# Patient Record
Sex: Female | Born: 1975 | Race: Black or African American | Marital: Single | Smoking: Never smoker
Health system: Southern US, Community
[De-identification: ages and names within clinical notes are randomized; demographics above are authoritative.]

## PROBLEM LIST (undated history)

## (undated) ENCOUNTER — Inpatient Hospital Stay: Payer: Self-pay

## (undated) DIAGNOSIS — D649 Anemia, unspecified: Secondary | ICD-10-CM

## (undated) DIAGNOSIS — I10 Essential (primary) hypertension: Secondary | ICD-10-CM

## (undated) DIAGNOSIS — R011 Cardiac murmur, unspecified: Secondary | ICD-10-CM

## (undated) HISTORY — PX: WISDOM TOOTH EXTRACTION: SHX21

---

## 2012-05-09 DIAGNOSIS — R21 Rash and other nonspecific skin eruption: Secondary | ICD-10-CM | POA: Insufficient documentation

## 2012-09-04 DIAGNOSIS — Z22330 Carrier of Group B streptococcus: Secondary | ICD-10-CM | POA: Insufficient documentation

## 2012-09-04 DIAGNOSIS — O480 Post-term pregnancy: Secondary | ICD-10-CM | POA: Insufficient documentation

## 2015-09-16 NOTE — L&D Delivery Note (Signed)
Delivery Note  First Stage: Labor induction with buccal Cytotec, then Pitocin, and Cervidil Labor onset: 08/30/16 @ 1120 am Augmentation: Pitocin  Analgesia /Anesthesia intrapartum: epidural  SROM at 1120   Second Stage: Complete dilation at 1321 Onset of pushing at 1325 FHR second stage: Category 2: Baseline: 125 bpm/ moderate variability/ +accels/ variable decels with each ctx to nadir of 60 bpm with good return to baseline and positive scalp stimulation   She progressed from 2cm at 1120 to complete at 1321 after SROM.  She pushed 3 times with delivery of a viable female at [id number] by Carlean JewsMeredith Sigmon, CNM in LOA position.  She had a loose nuchal cord reduced over head.  Baby was placed on mom's abdomen, vigorous, and dried. The cord double clamped after cessation of pulsation, cut by FOB. Cord blood sample collected   Third Stage: Placenta delivered via Tomasa BlaseSchultz intact with trailing membranes I teased out using ring forceps with a 3 VC @ 1412 Placenta disposition: surgical pathology  Uterine tone firm / bleeding minimal  No lacerations identified  Est. Blood Loss (mL): 350 mL  Mom to postpartum.  Baby to Couplet care / Skin to Skin.  Newborn: Birth Weight: 2510 grams (5#9oz) Apgar Scores: 8, 9 Feeding planned: Formula   Carlean JewsMeredith Sigmon, CNM

## 2015-10-21 ENCOUNTER — Encounter: Payer: Self-pay | Admitting: Emergency Medicine

## 2015-10-21 ENCOUNTER — Emergency Department
Admission: EM | Admit: 2015-10-21 | Discharge: 2015-10-21 | Disposition: A | Discharge status: Home / Self Care | Payer: BC Managed Care – PPO | Attending: Emergency Medicine | Admitting: Emergency Medicine

## 2015-10-21 ENCOUNTER — Emergency Department: Payer: BC Managed Care – PPO

## 2015-10-21 DIAGNOSIS — S99911A Unspecified injury of right ankle, initial encounter: Secondary | ICD-10-CM | POA: Diagnosis present

## 2015-10-21 DIAGNOSIS — Y9389 Activity, other specified: Secondary | ICD-10-CM | POA: Diagnosis not present

## 2015-10-21 DIAGNOSIS — Y9289 Other specified places as the place of occurrence of the external cause: Secondary | ICD-10-CM | POA: Insufficient documentation

## 2015-10-21 DIAGNOSIS — Y998 Other external cause status: Secondary | ICD-10-CM | POA: Diagnosis not present

## 2015-10-21 DIAGNOSIS — X58XXXA Exposure to other specified factors, initial encounter: Secondary | ICD-10-CM | POA: Diagnosis not present

## 2015-10-21 DIAGNOSIS — S92001A Unspecified fracture of right calcaneus, initial encounter for closed fracture: Secondary | ICD-10-CM | POA: Diagnosis not present

## 2015-10-21 MED ORDER — HYDROCODONE-ACETAMINOPHEN 5-325 MG PO TABS
1.0000 | ORAL_TABLET | Freq: Once | ORAL | Status: AC
  Administered 2015-10-21: 1 via ORAL
  Filled 2015-10-21: qty 1

## 2015-10-21 MED ORDER — HYDROCODONE-ACETAMINOPHEN 5-325 MG PO TABS: 1.0000 | ORAL_TABLET | ORAL | Status: DC | PRN

## 2015-10-21 NOTE — Discharge Instructions (Signed)
Cast or Splint Care °Casts and splints support injured limbs and keep bones from moving while they heal.  °HOME CARE °· Keep the cast or splint uncovered during the drying period. °¨ A plaster cast can take 24 to 48 hours to dry. °¨ A fiberglass cast will dry in less than 1 hour. °· Do not rest the cast on anything harder than a pillow for 24 hours. °· Do not put weight on your injured limb. Do not put pressure on the cast. Wait for your doctor's approval. °· Keep the cast or splint dry. °¨ Cover the cast or splint with a plastic bag during baths or wet weather. °¨ If you have a cast over your chest and belly (trunk), take sponge baths until the cast is taken off. °¨ If your cast gets wet, dry it with a towel or blow dryer. Use the cool setting on the blow dryer. °· Keep your cast or splint clean. Wash a dirty cast with a damp cloth. °· Do not put any objects under your cast or splint. °· Do not scratch the skin under the cast with an object. If itching is a problem, use a blow dryer on a cool setting over the itchy area. °· Do not trim or cut your cast. °· Do not take out the padding from inside your cast. °· Exercise your joints near the cast as told by your doctor. °· Raise (elevate) your injured limb on 1 or 2 pillows for the first 1 to 3 days. °GET HELP IF: °· Your cast or splint cracks. °· Your cast or splint is too tight or too loose. °· You itch badly under the cast. °· Your cast gets wet or has a soft spot. °· You have a bad smell coming from the cast. °· You get an object stuck under the cast. °· Your skin around the cast becomes red or sore. °· You have new or more pain after the cast is put on. °GET HELP RIGHT AWAY IF: °· You have fluid leaking through the cast. °· You cannot move your fingers or toes. °· Your fingers or toes turn blue or white or are cool, painful, or puffy (swollen). °· You have tingling or lose feeling (numbness) around the injured area. °· You have bad pain or pressure under the  cast. °· You have trouble breathing or have shortness of breath. °· You have chest pain. °  °This information is not intended to replace advice given to you by your health care provider. Make sure you discuss any questions you have with your health care provider. °  °Document Released: 01/01/2011 Document Revised: 05/04/2013 Document Reviewed: 03/10/2013 °Elsevier Interactive Patient Education ©2016 [employer] ° °

## 2015-10-21 NOTE — ED Notes (Signed)
Pt hurt ankle early this morning around 230-3am. Swelling noted to right lateral ankle and pt states she has difficulty bearing weight to right leg.

## 2015-10-21 NOTE — ED Provider Notes (Signed)
CSN: [id number]     Arrival date & time 10/21/15  0819 History   None    Chief Complaint  Patient presents with  . Ankle Pain      HPI Comments: 40 year old female presents today complaining of right ankle injury that occurred between 2:30-3am. Pt reports that she was standing on a stool helping her sister hang something when she stepped down to the ground and inverted the ankle. Since then it has been difficult to bear weight on the ankle. She has no history of prior ankle injuries. Has not taken anything for pain prior to arrival. Reports numbness in the foot and states she can not move her ankle or toes.   The history is provided by the patient.    History reviewed. No pertinent past medical history. History reviewed. No pertinent past surgical history. History reviewed. No pertinent family history. Social History  Substance Use Topics  . Smoking status: Never Smoker   . Smokeless tobacco: None  . Alcohol Use: Yes   OB History    No data available     Review of Systems  Musculoskeletal: Positive for myalgias, joint swelling, arthralgias and gait problem.  Skin: Negative for wound.  Neurological: Positive for numbness.  All other systems reviewed and are negative.     Allergies  Sulfa antibiotics  Home Medications   Prior to Admission medications   Medication Sig Start Date End Date Taking? Authorizing Provider  HYDROcodone-acetaminophen (NORCO/VICODIN) 5-325 MG tablet Take 1 tablet by mouth every 4 (four) hours as needed for severe pain. 10/21/15   Christella Scheuermann, PA-C   BP 156/89 mmHg  Pulse 98  Temp(Src) 98.5 F (36.9 C) (Oral)  Resp 16  Ht  (1.753 m)  Wt 63.504 kg  BMI 20.67 kg/m2  SpO2 100%  LMP 09/14/2015 Physical Exam  Constitutional: She is oriented to person, place, and time. Vital signs are normal. She appears well-developed and well-nourished. She is active. She appears distressed.  Mild distress due to pain   HENT:  Head: Normocephalic and  atraumatic.  Cardiovascular: Intact distal pulses.   Musculoskeletal: She exhibits tenderness.       Right ankle: She exhibits decreased range of motion and swelling. She exhibits no ecchymosis, no laceration and normal pulse. Tenderness. Lateral malleolus and head of 5th metatarsal tenderness found. No medial malleolus tenderness found. Achilles tendon normal.       Feet:  Cap refill < 2 seconds all toes. Pulses 2+ dorsalis pedis and posterior tibialis   Neurological: She is alert and oriented to person, place, and time.  Skin: Skin is warm and dry.  Psychiatric: She has a normal mood and affect. Her behavior is normal. Judgment and thought content normal.  Nursing note and vitals reviewed.   ED Course  Procedures (including critical care time) Labs Review Labs Reviewed - No data to display  Imaging Review Dg Ankle Complete Right  10/21/2015  ADDENDUM REPORT: 10/21/2015 09:48 ADDENDUM: There does appear to be a displaced calcaneal fracture best seen on oblique projection. Electronically Signed   By: Lupita Raider, M.D.   On: 10/21/2015 09:48  10/21/2015  CLINICAL DATA:  Acute right ankle pain after injury last night. Initial encounter. EXAM: RIGHT ANKLE - COMPLETE 3+ VIEW COMPARISON:  None. FINDINGS: There is no evidence of fracture, dislocation, or joint effusion. There is no evidence of arthropathy or other focal bone abnormality. Soft tissue swelling over lateral malleolus is noted. IMPRESSION: No fracture or  dislocation is noted. Soft tissue swelling is seen over lateral malleolus suggesting ligamentous injury. Electronically Signed: By: Lupita Raider, M.D. On: 10/21/2015 09:39   Dg Foot Complete Right  10/21/2015  CLINICAL DATA:  Pt stepped off a chair last night, pain and swelling mostly at the lateral malleolus area. Shielded. No previous injuries nor surgeries. Pt was in extreme pain- best images obtained EXAM: RIGHT FOOT COMPLETE - 3+ VIEW COMPARISON:  None. FINDINGS: Oblique  fracture of the calcaneus extending from the neck posteriorly through the superior aspect of the posterior calcaneus. Boehler's angle is intact. Fracture seen on the oblique view. IMPRESSION: Oblique fracture through the calcaneus which is minimally displaced. Electronically Signed   By: Genevive Bi M.D.   On: 10/21/2015 09:43   I have personally reviewed and evaluated these images and lab results as part of my medical decision-making.   EKG Interpretation None      MDM  10:00am - Discussed case with orthopedist on-call, Dr. Massie Maroon who recommend bulky posterior splint with slab at the heel. Non weight bearing. Needs to be seen in the office this week - Dr. Ernest Pine will be who she follows up with RX for Norco 5-325 #20 pills. Recommended taking Motrin in between these doses. Splint placed by tech and crutches provided to patient. No weight bearing at any time. Call Dr. Elenor Legato office tomorrow for appointment this week.  Final diagnoses:  Calcaneus fracture, right, closed, initial encounter        Christella Scheuermann, PA-C 10/21/15 1005  Sharman Cheek, MD 10/21/15 1520

## 2015-10-21 NOTE — ED Notes (Signed)
XR at bedside

## 2015-10-21 NOTE — ED Notes (Signed)
Pt was standing on chair helping sister hang something and stepped off wrong. Swelling to right ankle.

## 2015-10-23 ENCOUNTER — Other Ambulatory Visit: Payer: Self-pay | Admitting: Podiatry

## 2015-10-23 DIAGNOSIS — S92064A Nondisplaced intraarticular fracture of right calcaneus, initial encounter for closed fracture: Secondary | ICD-10-CM

## 2015-10-25 ENCOUNTER — Ambulatory Visit
Admission: RE | Admit: 2015-10-25 | Discharge: 2015-10-25 | Disposition: A | Discharge status: Home / Self Care | Payer: BC Managed Care – PPO | Source: Ambulatory Visit | Attending: Podiatry | Admitting: Podiatry

## 2015-10-25 DIAGNOSIS — S92061A Displaced intraarticular fracture of right calcaneus, initial encounter for closed fracture: Secondary | ICD-10-CM | POA: Insufficient documentation

## 2015-10-25 DIAGNOSIS — S92064A Nondisplaced intraarticular fracture of right calcaneus, initial encounter for closed fracture: Secondary | ICD-10-CM

## 2015-10-25 DIAGNOSIS — S92214A Nondisplaced fracture of cuboid bone of right foot, initial encounter for closed fracture: Secondary | ICD-10-CM | POA: Insufficient documentation

## 2015-10-25 DIAGNOSIS — X58XXXA Exposure to other specified factors, initial encounter: Secondary | ICD-10-CM | POA: Diagnosis not present

## 2015-11-01 ENCOUNTER — Encounter: Payer: Self-pay | Admitting: Medical Oncology

## 2015-11-01 ENCOUNTER — Emergency Department
Admission: EM | Admit: 2015-11-01 | Discharge: 2015-11-01 | Disposition: A | Discharge status: Home / Self Care | Payer: BC Managed Care – PPO | Attending: Emergency Medicine | Admitting: Emergency Medicine

## 2015-11-01 DIAGNOSIS — M79671 Pain in right foot: Secondary | ICD-10-CM

## 2015-11-01 DIAGNOSIS — M25571 Pain in right ankle and joints of right foot: Secondary | ICD-10-CM

## 2015-11-01 MED ORDER — HYDROCODONE-ACETAMINOPHEN 5-325 MG PO TABS: 1.0000 | ORAL_TABLET | ORAL | Status: DC | PRN

## 2015-11-01 MED ORDER — MELOXICAM 15 MG PO TABS: 15.0000 mg | ORAL_TABLET | Freq: Every day | ORAL | Status: DC

## 2015-11-01 NOTE — ED Notes (Signed)
Pt reports she broke her rt heel 2 weeks ago and was seen here and then f/u with ortho, continues to have pain.

## 2015-11-01 NOTE — ED Notes (Signed)
States she was dx'd with heel fx about 2 weeks ago after jumping from chair  Right foot is in boot. Out of pain meds

## 2015-11-01 NOTE — Discharge Instructions (Signed)
Cryotherapy °Cryotherapy means treatment with cold. Ice or gel packs can be used to reduce both pain and swelling. Ice is the most helpful within the first 24 to 48 hours after an injury or flare-up from overusing a muscle or joint. Sprains, strains, spasms, burning pain, shooting pain, and aches can all be eased with ice. Ice can also be used when recovering from surgery. Ice is effective, has very few side effects, and is safe for most people to use. °PRECAUTIONS  °Ice is not a safe treatment option for people with: °· Raynaud phenomenon. This is a condition affecting small blood vessels in the extremities. Exposure to cold may cause your problems to return. °· Cold hypersensitivity. There are many forms of cold hypersensitivity, including: °¨ Cold urticaria. Red, itchy hives appear on the skin when the tissues begin to warm after being iced. °¨ Cold erythema. This is a red, itchy rash caused by exposure to cold. °¨ Cold hemoglobinuria. Red blood cells break down when the tissues begin to warm after being iced. The hemoglobin that carry oxygen are passed into the urine because they cannot combine with blood proteins fast enough. °· Numbness or altered sensitivity in the area being iced. °If you have any of the following conditions, do not use ice until you have discussed cryotherapy with your caregiver: °· Heart conditions, such as arrhythmia, angina, or chronic heart disease. °· High blood pressure. °· Healing wounds or open skin in the area being iced. °· Current infections. °· Rheumatoid arthritis. °· Poor circulation. °· Diabetes. °Ice slows the blood flow in the region it is applied. This is beneficial when trying to stop inflamed tissues from spreading irritating chemicals to surrounding tissues. However, if you expose your skin to cold temperatures for too long or without the proper protection, you can damage your skin or nerves. Watch for signs of skin damage due to cold. °HOME CARE INSTRUCTIONS °Follow  these tips to use ice and cold packs safely. °· Place a dry or damp towel between the ice and skin. A damp towel will cool the skin more quickly, so you may need to shorten the time that the ice is used. °· For a more rapid response, add gentle compression to the ice. °· Ice for no more than 10 to 20 minutes at a time. The bonier the area you are icing, the less time it will take to get the benefits of ice. °· Check your skin after 5 minutes to make sure there are no signs of a poor response to cold or skin damage. °· Rest 20 minutes or more between uses. °· Once your skin is numb, you can end your treatment. You can test numbness by very lightly touching your skin. The touch should be so light that you do not see the skin dimple from the pressure of your fingertip. When using ice, most people will feel these normal sensations in this order: cold, burning, aching, and numbness. °· Do not use ice on someone who cannot communicate their responses to pain, such as small children or people with dementia. °HOW TO MAKE AN ICE PACK °Ice packs are the most common way to use ice therapy. Other methods include ice massage, ice baths, and cryosprays. Muscle creams that cause a cold, tingly feeling do not offer the same benefits that ice offers and should not be used as a substitute unless recommended by your caregiver. °To make an ice pack, do one of the following: °· Place crushed ice or a   bag of frozen vegetables in a sealable plastic bag. Squeeze out the excess air. Place this bag inside another plastic bag. Slide the bag into a pillowcase or place a damp towel between your skin and the bag.  Mix 3 parts water with 1 part rubbing alcohol. Freeze the mixture in a sealable plastic bag. When you remove the mixture from the freezer, it will be slushy. Squeeze out the excess air. Place this bag inside another plastic bag. Slide the bag into a pillowcase or place a damp towel between your skin and the bag. SEEK MEDICAL CARE  IF:  You develop white spots on your skin. This may give the skin a blotchy (mottled) appearance.  Your skin turns blue or pale.  Your skin becomes waxy or hard.  Your swelling gets worse. MAKE SURE YOU:   Understand these instructions.  Will watch your condition.  Will get help right away if you are not doing well or get worse.   This information is not intended to replace advice given to you by your health care provider. Make sure you discuss any questions you have with your health care provider.   Document Released: 04/28/2011 Document Revised: 09/22/2014 Document Reviewed: 04/28/2011 Elsevier Interactive Patient Education [employer]   Begin taking meloxicam once a day with food. Norco as needed for severe pain. Elevate your foot and ankle frequently to decrease swelling. Call and make an appointment with Dr. Ether Griffins.

## 2015-11-01 NOTE — ED Provider Notes (Signed)
[facility] Emergency Department Provider Note  ____________________________________________  Time seen: Approximately 10:59 AM  I have reviewed the triage vital signs and the nursing notes.   HISTORY  Chief Complaint Foot Pain   HPI Jasmine Pruitt is a 40 y.o. female is here because she is out of her pain medication. Patient was seen in the emergency room and diagnosed with a fracture and referred to podiatry at [facility]. She is only seen Dr. Ether Griffins and placed in a cam walker. Patient apparently did not make a follow-up appointment with Dr. Ether Griffins because financial reasons. There is note in her chart that secretary had told her that they were in work with her financially but no appointment has been made thus far. She is here today to get refills on her pain medication.Lites rates her pain as 9 out of 10.   History reviewed. No pertinent past medical history.  There are no active problems to display for this patient.   History reviewed. No pertinent past surgical history.  Current Outpatient Rx  Name  Route  Sig  Dispense  Refill  . HYDROcodone-acetaminophen (NORCO/VICODIN) 5-325 MG tablet   Oral   Take 1 tablet by mouth every 4 (four) hours as needed for moderate pain.   12 tablet   0   . meloxicam (MOBIC) 15 MG tablet   Oral   Take 1 tablet (15 mg total) by mouth daily.   30 tablet   2     Allergies Sulfa antibiotics  No family history on file.  Social History Social History  Substance Use Topics  . Smoking status: Never Smoker   . Smokeless tobacco: None  . Alcohol Use: Yes    Review of Systems Constitutional: No fever/chills .Cardiovascular: Denies chest pain. Respiratory: Denies shortness of breath. Gastrointestinal: No abdominal pain.  No nausea, no vomiting.  Musculoskeletal: Negative for back pain. Positive right foot / ankle pain Skin: Negative for rash. Neurological: Negative for headaches, focal weakness or  numbness.  10-point ROS otherwise negative.  ____________________________________________   PHYSICAL EXAM:  VITAL SIGNS: ED Triage Vitals  Enc Vitals Group     BP 11/01/15 1009 147/95 mmHg     Pulse Rate 11/01/15 1009 70     Resp 11/01/15 1009 18     Temp 11/01/15 1009 98.2 F (36.8 C)     Temp Source 11/01/15 1009 Oral     SpO2 11/01/15 1009 100 %     Weight 11/01/15 1009 150 lb (68.04 kg)     Height 11/01/15 1009  (1.753 m)     Head Cir --      Peak Flow --      Pain Score 11/01/15 1009 9     Pain Loc --      Pain Edu? --      Excl. in GC? --     Constitutional: Alert and oriented. Well appearing and in no acute distress. Eyes: Conjunctivae are normal. PERRL. EOMI. Head: Atraumatic. Nose: No congestion/rhinnorhea. Neck: No stridor.  Supple Cardiovascular: Normal rate, regular rhythm. Grossly normal heart sounds.  Good peripheral circulation. Respiratory: Normal respiratory effort.  No retractions. Lungs CTAB. Gastrointestinal: Soft and nontender. No distention.  Musculoskeletal: Patient is currently in a cam walker with leg elevated on stretcher. She does not appear to be any acute distress and basically is requesting pain medication. There is been no change and no reinjury of her fracture. Neurologic:  Normal speech and language. No gross focal neurologic deficits  are appreciated. No gait instability. Skin:  Skin is warm, dry and intact. No rash noted. Psychiatric: Mood and affect are normal. Speech and behavior are normal.  ____________________________________________   LABS (all labs ordered are listed, but only abnormal results are displayed)  Labs Reviewed - No data to display  PROCEDURES  Procedure(s) performed: None  Critical Care performed: No  ____________________________________________   INITIAL IMPRESSION / ASSESSMENT AND PLAN / ED COURSE  Pertinent labs & imaging results that were available during my care of the patient were reviewed by  me and considered in my medical decision making (see chart for details).  Patient was referred back to Dr. Ether Griffins. She was started on meloxicam daily for inflammation and given Norco 12 tablets for severe pain until she can get an appointment with Dr. Ether Griffins. ____________________________________________   FINAL CLINICAL IMPRESSION(S) / ED DIAGNOSES  Final diagnoses:  Acute right ankle pain  Acute foot pain, right      Tommi Rumps, PA-C 11/01/15 1404  Emily Filbert, MD 11/01/15 1520

## 2016-04-17 ENCOUNTER — Other Ambulatory Visit: Payer: Self-pay | Admitting: Physician Assistant

## 2016-04-17 DIAGNOSIS — O09529 Supervision of elderly multigravida, unspecified trimester: Secondary | ICD-10-CM

## 2016-04-17 LAB — OB RESULTS CONSOLE HIV ANTIBODY (ROUTINE TESTING): HIV: NONREACTIVE

## 2016-04-18 LAB — OB RESULTS CONSOLE ANTIBODY SCREEN: ANTIBODY SCREEN: NEGATIVE

## 2016-04-18 LAB — OB RESULTS CONSOLE VARICELLA ZOSTER ANTIBODY, IGG: VARICELLA IGG: IMMUNE

## 2016-04-18 LAB — OB RESULTS CONSOLE ABO/RH: RH Type: POSITIVE

## 2016-04-18 LAB — OB RESULTS CONSOLE RPR: RPR: NONREACTIVE

## 2016-04-18 LAB — OB RESULTS CONSOLE HEPATITIS B SURFACE ANTIGEN: Hepatitis B Surface Ag: NEGATIVE

## 2016-04-18 LAB — OB RESULTS CONSOLE RUBELLA ANTIBODY, IGM: RUBELLA: IMMUNE

## 2016-04-19 LAB — OB RESULTS CONSOLE GC/CHLAMYDIA
Chlamydia: NEGATIVE
Gonorrhea: NEGATIVE

## 2016-05-01 ENCOUNTER — Ambulatory Visit
Admission: RE | Admit: 2016-05-01 | Discharge: 2016-05-01 | Disposition: A | Discharge status: Home / Self Care | Payer: BC Managed Care – PPO | Source: Ambulatory Visit | Attending: Maternal & Fetal Medicine | Admitting: Maternal & Fetal Medicine

## 2016-05-01 ENCOUNTER — Ambulatory Visit (HOSPITAL_BASED_OUTPATIENT_CLINIC_OR_DEPARTMENT_OTHER)
Admission: RE | Admit: 2016-05-01 | Discharge: 2016-05-01 | Disposition: A | Discharge status: Home / Self Care | Payer: BC Managed Care – PPO | Source: Ambulatory Visit | Attending: Maternal & Fetal Medicine | Admitting: Maternal & Fetal Medicine

## 2016-05-01 ENCOUNTER — Encounter: Payer: Self-pay | Admitting: *Deleted

## 2016-05-01 ENCOUNTER — Ambulatory Visit
Admission: RE | Admit: 2016-05-01 | Discharge: 2016-05-01 | Disposition: A | Discharge status: Home / Self Care | Payer: BC Managed Care – PPO | Source: Ambulatory Visit | Attending: Physician Assistant | Admitting: Physician Assistant

## 2016-05-01 DIAGNOSIS — Z8379 Family history of other diseases of the digestive system: Secondary | ICD-10-CM

## 2016-05-01 DIAGNOSIS — O09529 Supervision of elderly multigravida, unspecified trimester: Secondary | ICD-10-CM

## 2016-05-01 DIAGNOSIS — O09521 Supervision of elderly multigravida, first trimester: Secondary | ICD-10-CM

## 2016-05-01 NOTE — Progress Notes (Signed)
I agree with the assessment and plan as outlined in [facility] Wells's note.  

## 2016-05-01 NOTE — Progress Notes (Signed)
Referring Provider:  Terance IceStreilein, Anna Marie Length of Consultation: 30 minutes  Ms. Jasmine Pruitt was [facility] for genetic counseling because of advanced maternal age.  The patient will be 40 years old at the time of delivery.  This note summarizes the information we discussed.    We explained that the chance of a chromosome abnormality increases with maternal age.  Chromosomes and examples of chromosome problems were reviewed.  Humans typically have 46 chromosomes in each cell, with half passed through each sperm and egg.  Any change in the number or structure of chromosomes can increase the risk of problems in the physical and mental development of a pregnancy.   Based upon age of the patient and the current gestational age, the chance of any chromosome abnormality was 1 in 5356. The chance of Down syndrome, the most common chromosome problem associated with maternal age, was 1 in 1796.  The risk of chromosome problems is in addition to the 3% general population risk for birth defects and mental retardation.  The greatest chance, of course, is that the baby would be born in good health.  We discussed the following prenatal screening and testing options for this pregnancy:  Maternal serum marker screening, a blood test that measures pregnancy proteins, can provide risk assessments for Down syndrome, trisomy 18, and open neural tube defects (spina bifida, anencephaly). Because it does not directly examine the fetus, it cannot positively diagnose or rule out these problems.  Targeted ultrasound uses high frequency sound waves to create an image of the developing fetus.  An ultrasound is often recommended as a routine means of evaluating the pregnancy.  It is also used to screen for fetal anatomy problems (for example, a heart defect) that might be suggestive of a chromosomal or other abnormality.   Amniocentesis involves the removal of a small amount of amniotic fluid from the sac  surrounding the fetus with the use of a thin needle inserted through the maternal abdomen and uterus.  Ultrasound guidance is used throughout the procedure.  Fetal cells from amniotic fluid are directly evaluated and > 99.5% of chromosome problems and > 98% of open neural tube defects can be detected. This procedure is generally performed after the 15th week of pregnancy.  The main risks to this procedure include complications leading to miscarriage in less than 1 in 200 cases (0.5%).  We also reviewed the availability of cell free fetal DNA testing from maternal blood to determine whether or not the baby may have either Down syndrome, trisomy 8013, or trisomy 2318.  This test utilizes a maternal blood sample and DNA sequencing technology to isolate circulating cell free fetal DNA from maternal plasma.  The fetal DNA can then be analyzed for DNA sequences that are derived from the three most common chromosomes involved in aneuploidy, chromosomes 13, 18, and 21.  If the overall amount of DNA is greater than the expected level for any of these chromosomes, aneuploidy is suspected.  While we do not consider it a replacement for invasive testing and karyotype analysis, a negative result from this testing would be reassuring, though not a guarantee of a normal chromosome complement for the baby.  An abnormal result is certainly suggestive of an abnormal chromosome complement, though we would still recommend amniocentesis to confirm any findings from this testing.  Cystic Fibrosis and Spinal Muscular Atrophy (SMA) screening were also discussed with the patient. Both conditions are recessive, which means that both parents must be carriers  in order to have a child with the disease.  Cystic fibrosis (CF) is one of the most common genetic conditions in persons of Caucasian ancestry.  This condition occurs in approximately 1 in 2,500 Caucasian persons and results in thickened secretions in the lungs, digestive, and  reproductive systems.  For a baby to be at risk for having CF, both of the parents must be carriers for this condition.  Approximately 1 in 68 Caucasian persons is a carrier for CF.  Current carrier testing looks for the most common mutations in the gene for CF and can detect approximately 90% of carriers in the Caucasian population.  This means that the carrier screening can greatly reduce, but cannot eliminate, the chance for an individual to have a child with CF.  If an individual is found to be a carrier for CF, then carrier testing would be available for the partner. As part of Jasmine Pruitt's newborn screening profile, all babies born in the state of [place] will have a two-tier screening process.  Specimens are first tested to determine the concentration of immunoreactive trypsinogen (IRT).  The top 5% of specimens with the highest IRT values then undergo DNA testing using a panel of over 40 common CF mutations. SMA is a neurodegenerative disorder that leads to atrophy of skeletal muscle and overall weakness.  This condition is also more prevalent in the Caucasian population, with 1 in 40-1 in 60 persons being a carrier and 1 in 6,000-1 in 10,000 children being affected.  There are multiple forms of the disease, with some causing death in infancy to other forms with survival into adulthood.  The genetics of SMA is complex, but carrier screening can detect up to 95% of carriers in the Caucasian population.  Similar to CF, a negative result can greatly reduce, but cannot eliminate, the chance to have a child with SMA.  We obtained a detailed family history and pregnancy history.  Ms. Jasmine Pruitt stated that this is her 10th pregnancy.  She has one 40 year old daughter with a different partner.  Together, she and her current partner have five children, ages 10, 76, 38, 16 and 3 years.  They have had two elective terminations for personal reasons and one spontaneous miscarriage.  Their children are all  currently in good health with normal development.  Two of the children (53 year old son and 57 year old daughter) were diagnosed with Hirschsprung disease.  The three year old son was treated for pyloric stenosis shortly after birth.    Hirschsprung disease is caused by absence of nerve cells between the muscular layers in the colon.  Up to 1/3 of babies with Hirschsprung disease will have other birth defects or a known genetic syndrome as the cause for their condition.  In the other 2/3 of cases, it is thought to occur as an isolated condition, without additional birth defects or developmental concerns.  As an isolated condition, it is inherited as a multifactorial trait.  This means that it is most likely caused by a combination of genetic and environmental factors. It occurs most frequently in males, and recurrence risks are varied depending upon the gender of the affected family member. The recurrence is also thought to be further increased with a larger segment of the colon involved in the disease.With two affected children, the risk would be further increased and we cannot rule out a recessive genetic change as the cause which could have as high as a 25% recurrence risk.  We would not expect to be able to definitively diagnose Hirschsprung disease based upon prenatal ultrasound or other testing prior to birth unless genetic testing had previously been done and shown a genetic change that could be tested for.  Pyloric stenosis is a thickening of the muscles of the pylorus, the opening between the stomach and small intestine.  This condition results in projectile vomiting in affected infants, as the food cannot pass properly out of the stomach.  Most cases of pyloric stenosis occur as an isolated birth defect, without additional birth defects or developmental concerns.  As an isolated condition, it is inherited as a multifactorial condition.  This means that it is most likely caused by a combination of  genetic and environmental factors. It occurs most frequently in males, and recurrence risks are varied depending upon the gender of the affected family member, with estimates in the 2-4% range for recurrence in a sibling of an affected female.  The remainder of the family history is unremarkable for birth defects, mental retardation, recurrent pregnancy loss or known chromosome abnormalities. She reported no complications in the current pregnancy.  Ms. Jasmine Pruitt reported x-rays and pain medication in February for a foot injury as well as alcohol and marijuana use in March.  However, current dating by ultrasound shows an EDD of 09/17/2016, which means conception was likely in April.  Because she was not yet pregnant, we would not expect these exposures to be concerning for the development of this pregnancy.  After consideration of the options, Jasmine Pruitt elected to proceed with an ultrasound only today.  She declined all other screening and testing options.  An ultrasound was performed at the time of the visit.  The gestational age was consistent with  20 weeks.   No markers of aneuploidy were noted, but it is important to remember that a normal ultrasound does not exclude the possibility of birth defect or chromosome condition.  Please refer to the ultrasound report for details of that study.  We would recommend a follow up ultrasound in the third trimester, as some cases of Hirschsprung may show dilated bowel in the third trimester.  Ms. Jasmine Pruitt was encouraged to call with questions or concerns.  We can be contacted at [phone].   Cherly Andersoneborah F. Charliee Krenz, MS, CGC

## 2016-06-23 ENCOUNTER — Other Ambulatory Visit: Payer: Self-pay | Admitting: *Deleted

## 2016-06-23 ENCOUNTER — Observation Stay
Admission: EM | Admit: 2016-06-23 | Discharge: 2016-06-23 | Disposition: A | Discharge status: Home / Self Care | Payer: BC Managed Care – PPO | Attending: Obstetrics and Gynecology | Admitting: Obstetrics and Gynecology

## 2016-06-23 DIAGNOSIS — O26899 Other specified pregnancy related conditions, unspecified trimester: Secondary | ICD-10-CM | POA: Diagnosis present

## 2016-06-23 DIAGNOSIS — O26893 Other specified pregnancy related conditions, third trimester: Secondary | ICD-10-CM | POA: Diagnosis present

## 2016-06-23 DIAGNOSIS — R109 Unspecified abdominal pain: Secondary | ICD-10-CM

## 2016-06-23 DIAGNOSIS — R1031 Right lower quadrant pain: Secondary | ICD-10-CM | POA: Insufficient documentation

## 2016-06-23 DIAGNOSIS — O09521 Supervision of elderly multigravida, first trimester: Secondary | ICD-10-CM

## 2016-06-23 DIAGNOSIS — Z3A28 28 weeks gestation of pregnancy: Secondary | ICD-10-CM | POA: Insufficient documentation

## 2016-06-23 LAB — CBC
HCT: 35.1 % (ref 35.0–47.0)
Hemoglobin: 11.6 g/dL — ABNORMAL LOW (ref 12.0–16.0)
MCH: 28.7 pg (ref 26.0–34.0)
MCHC: 33.1 g/dL (ref 32.0–36.0)
MCV: 86.6 fL (ref 80.0–100.0)
PLATELETS: 217 10*3/uL (ref 150–440)
RBC: 4.05 MIL/uL (ref 3.80–5.20)
RDW: 13 % (ref 11.5–14.5)
WBC: 9.7 10*3/uL (ref 3.6–11.0)

## 2016-06-23 LAB — BASIC METABOLIC PANEL
Anion gap: 5 (ref 5–15)
CALCIUM: 8.7 mg/dL — AB (ref 8.9–10.3)
CO2: 25 mmol/L (ref 22–32)
Chloride: 106 mmol/L (ref 101–111)
Creatinine, Ser: 0.54 mg/dL (ref 0.44–1.00)
GFR calc Af Amer: >60 mL/min (ref >60)
GLUCOSE: 92 mg/dL (ref 65–99)
POTASSIUM: 3.2 mmol/L — AB (ref 3.5–5.1)
SODIUM: 136 mmol/L (ref 135–145)

## 2016-06-23 LAB — URINALYSIS COMPLETE WITH MICROSCOPIC (ARMC ONLY)
BILIRUBIN URINE: NEGATIVE
Bacteria, UA: NONE SEEN
GLUCOSE, UA: NEGATIVE mg/dL
HGB URINE DIPSTICK: NEGATIVE
Ketones, ur: NEGATIVE mg/dL
NITRITE: NEGATIVE
Protein, ur: NEGATIVE mg/dL
RBC / HPF: NONE SEEN RBC/hpf (ref 0–5)
Specific Gravity, Urine: 1.014 (ref 1.005–1.030)
pH: 7 (ref 5.0–8.0)

## 2016-06-23 NOTE — Progress Notes (Signed)
Jasmine Pruitt 11/14/1975 G10 P6 2777w0d presents for rlq pain starting last pm . ++ gas . No n/v . Good fetal movt  noLOF , no vaginal bleeding , O;BP 124/75   Pulse 70   Temp 98.1 F (36.7 C) (Oral)   Resp 17   LMP 10/25/2015 (LMP Unknown)  ABDsoft NT  CX no checked  NSTreassuring for 28 weeks  Labs:hct 35.1 , K+ 3.2 ua neg  A: rlq pain resolved with observation  P:d/c hom e Precautions given   Patient ID: [name], female   DOB: 11/14/1975, 40 y.o.   MRN: [id number]

## 2016-06-23 NOTE — OB Triage Note (Signed)
G10P6 arrived with complaints of lower left quadrant pain that is sharp.  It started around 0230.  Denies any leaking of fluids, blood, or vaginal discharge.  Pt states that she feels like it could be gas.  Will continue to monitor.

## 2016-06-23 NOTE — Discharge Planning (Signed)
Discharge instructions reviewed with patient including follow up appointments, signs of preterm labor, fetal movements counts, and when to seek medical attention. Patient discharged home, ambulatory with steady gait, no signs of distress observed at time of discharge.

## 2016-06-23 NOTE — Discharge Summary (Signed)
  Progress Notes Date of Service: 06/23/2016 7:39 AM Suzy Bouchardhomas J Mareli Antunes, MD  Obstetrics    [] Hide copied text [] Hover for attribution information Brittie Knights 08/16/1976 G10 P6 5954w0d presents for rlq pain starting last pm . ++ gas . No n/v . Good fetal movt  noLOF , no vaginal bleeding , O;BP 124/75   Pulse 70   Temp 98.1 F (36.7 C) (Oral)   Resp 17   LMP 10/25/2015 (LMP Unknown)  ABDsoft NT  CX no checked  NSTreassuring for 28 weeks  Labs:hct 35.1 , K+ 3.2 ua neg  A: rlq pain resolved with observation  P:d/c hom e Precautions given   Patient ID: Lavona MoundDekida Reasons, female   DOB: 09/07/1976, 40 y.o.   MRN: [id number]     Electronically signed by Suzy Bouchardhomas J Clancy Leiner, MD at 06/23/2016 7:42 AM      ED to Hosp-Admission (Current) on 06/23/2016

## 2016-06-23 NOTE — OB Triage Note (Signed)
Patient presented to L&D from ED department complaining of constant lower left sided abdominal pain that started this morning after she went to the bathroom.  Patient denies leaking of fluid, vaginal bleeding, decreased fetal movement, urgency or burning during urination or vaginal itching.  Patient states she doesn't feel like shes having contractions and that the pain feels "like gas pain".

## 2016-06-26 ENCOUNTER — Other Ambulatory Visit: Payer: Self-pay | Admitting: *Deleted

## 2016-06-26 ENCOUNTER — Other Ambulatory Visit: Payer: Self-pay | Admitting: Obstetrics and Gynecology

## 2016-06-26 ENCOUNTER — Ambulatory Visit
Admission: RE | Admit: 2016-06-26 | Discharge: 2016-06-26 | Disposition: A | Discharge status: Home / Self Care | Payer: BC Managed Care – PPO | Source: Ambulatory Visit | Attending: Obstetrics and Gynecology | Admitting: Obstetrics and Gynecology

## 2016-06-26 VITALS — BP 115/79 | HR 77 | Temp 98.3°F | Resp 18 | Wt 161.2 lb

## 2016-06-26 DIAGNOSIS — O09523 Supervision of elderly multigravida, third trimester: Secondary | ICD-10-CM

## 2016-06-26 DIAGNOSIS — Z3A28 28 weeks gestation of pregnancy: Secondary | ICD-10-CM | POA: Insufficient documentation

## 2016-06-26 LAB — OB RESULTS CONSOLE HIV ANTIBODY (ROUTINE TESTING): HIV: NONREACTIVE

## 2016-08-20 ENCOUNTER — Encounter: Payer: Self-pay | Admitting: *Deleted

## 2016-08-20 ENCOUNTER — Inpatient Hospital Stay
Admission: EM | Admit: 2016-08-20 | Discharge: 2016-08-20 | Disposition: A | Discharge status: Home / Self Care | Payer: BC Managed Care – PPO | Attending: Obstetrics and Gynecology | Admitting: Obstetrics and Gynecology

## 2016-08-20 ENCOUNTER — Inpatient Hospital Stay: Payer: BC Managed Care – PPO

## 2016-08-20 DIAGNOSIS — O09523 Supervision of elderly multigravida, third trimester: Secondary | ICD-10-CM

## 2016-08-20 DIAGNOSIS — Z3689 Encounter for other specified antenatal screening: Secondary | ICD-10-CM | POA: Insufficient documentation

## 2016-08-20 DIAGNOSIS — Z364 Encounter for antenatal screening for fetal growth retardation: Secondary | ICD-10-CM | POA: Diagnosis present

## 2016-08-20 DIAGNOSIS — O365930 Maternal care for other known or suspected poor fetal growth, third trimester, not applicable or unspecified: Secondary | ICD-10-CM | POA: Insufficient documentation

## 2016-08-20 DIAGNOSIS — Z3A35 35 weeks gestation of pregnancy: Secondary | ICD-10-CM | POA: Diagnosis not present

## 2016-08-20 HISTORY — DX: Cardiac murmur, unspecified: R01.1

## 2016-08-20 LAB — CBC WITH DIFFERENTIAL/PLATELET
BASOS ABS: 0 10*3/uL (ref 0–0.1)
BASOS PCT: 0 %
EOS PCT: 0 %
Eosinophils Absolute: 0 10*3/uL (ref 0–0.7)
HCT: 34.7 % — ABNORMAL LOW (ref 35.0–47.0)
Hemoglobin: 11.8 g/dL — ABNORMAL LOW (ref 12.0–16.0)
LYMPHS PCT: 17 %
Lymphs Abs: 1.7 10*3/uL (ref 1.0–3.6)
MCH: 28.9 pg (ref 26.0–34.0)
MCHC: 34 g/dL (ref 32.0–36.0)
MCV: 84.8 fL (ref 80.0–100.0)
MONO ABS: 0.5 10*3/uL (ref 0.2–0.9)
Monocytes Relative: 5 %
Neutro Abs: 7.6 10*3/uL — ABNORMAL HIGH (ref 1.4–6.5)
Neutrophils Relative %: 78 %
PLATELETS: 161 10*3/uL (ref 150–440)
RBC: 4.09 MIL/uL (ref 3.80–5.20)
RDW: 13.8 % (ref 11.5–14.5)
WBC: 9.8 10*3/uL (ref 3.6–11.0)

## 2016-08-20 LAB — COMPREHENSIVE METABOLIC PANEL
ALK PHOS: 122 U/L (ref 38–126)
ALT: 13 U/L — ABNORMAL LOW (ref 14–54)
ANION GAP: 5 (ref 5–15)
AST: 24 U/L (ref 15–41)
Albumin: 2.7 g/dL — ABNORMAL LOW (ref 3.5–5.0)
BILIRUBIN TOTAL: 0.5 mg/dL (ref 0.3–1.2)
CALCIUM: 8.8 mg/dL — AB (ref 8.9–10.3)
CO2: 24 mmol/L (ref 22–32)
Chloride: 108 mmol/L (ref 101–111)
Creatinine, Ser: 0.45 mg/dL (ref 0.44–1.00)
GFR calc Af Amer: >60 mL/min (ref >60)
Glucose, Bld: 96 mg/dL (ref 65–99)
POTASSIUM: 3.1 mmol/L — AB (ref 3.5–5.1)
Sodium: 137 mmol/L (ref 135–145)
TOTAL PROTEIN: 5.9 g/dL — AB (ref 6.5–8.1)

## 2016-08-20 LAB — URINE DRUG SCREEN, QUALITATIVE (ARMC ONLY)
Amphetamines, Ur Screen: NOT DETECTED
BARBITURATES, UR SCREEN: NOT DETECTED
Benzodiazepine, Ur Scrn: NOT DETECTED
CANNABINOID 50 NG, UR ~~LOC~~: NOT DETECTED
Cocaine Metabolite,Ur ~~LOC~~: NOT DETECTED
MDMA (ECSTASY) UR SCREEN: NOT DETECTED
METHADONE SCREEN, URINE: NOT DETECTED
Opiate, Ur Screen: NOT DETECTED
Phencyclidine (PCP) Ur S: NOT DETECTED
TRICYCLIC, UR SCREEN: NOT DETECTED

## 2016-08-20 LAB — PROTEIN / CREATININE RATIO, URINE
CREATININE, URINE: 21 mg/dL
Total Protein, Urine: <6 mg/dL

## 2016-08-20 LAB — URIC ACID: URIC ACID, SERUM: 3.6 mg/dL (ref 2.3–6.6)

## 2016-08-20 MED ORDER — HYDRALAZINE HCL 20 MG/ML IJ SOLN: 10.0000 mg | Freq: Once | INTRAMUSCULAR | Status: DC | PRN

## 2016-08-20 MED ORDER — LABETALOL HCL 5 MG/ML IV SOLN: 20.0000 mg | INTRAVENOUS | Status: DC | PRN

## 2016-08-20 NOTE — Discharge Instructions (Signed)
-  Fetal kicks count everyday.  -Go to health department x2/week to get blood pressures checked. Call to make an appointment for Friday, Dec. 8th and for Monday, Dec. 11th. Every 2 days thereafter.  -Every week have a NST (non-stress test) done at the [facility].  -Any signs and symptoms of preeclampsia, call/go your clinic at the [facility]. -Symptoms to watch out for are headaches that are not relieved with pain medicine. Blurred vision, pain in right upper abdomen, increase swelling.   -You have an appointment on Dec. 17th, Sunday night at 8pm for Induction of Labor. Call [phone] around 6:00pm to see if they everything is still ready for you.

## 2016-08-20 NOTE — Final Progress Note (Signed)
TRIAGE VISIT with NST   Jasmine Pruitt is a 40 y.o. [id number]. She is at 6737w2d gestation.  Indication: Elevated Blood Pressure  S: Resting comfortably. no CTX, no VB. Active fetal movement. Denies headache, SOB, new onset swelling, RUQ pain, visual changes.  O:  BP (!) 147/85   Pulse 66   Temp 98.2 F (36.8 C) (Oral)   Resp 18   LMP 10/25/2015 (LMP Unknown)  Results for orders placed or performed during the hospital encounter of 08/20/16 (from the past 48 hour(s))  Protein / creatinine ratio, urine   Collection Time: 08/20/16  5:36 PM  Result Value Ref Range   Creatinine, Urine 21 mg/dL   Total Protein, Urine <6 mg/dL   Protein Creatinine Ratio        0.00 - 0.15 mg/mg[Cre]  Urine Drug Screen, Qualitative ([facility] only)   Collection Time: 08/20/16  5:36 PM  Result Value Ref Range   Tricyclic, Ur Screen NONE DETECTED NONE DETECTED   Amphetamines, Ur Screen NONE DETECTED NONE DETECTED   MDMA (Ecstasy)Ur Screen NONE DETECTED NONE DETECTED   Cocaine Metabolite,Ur [place] NONE DETECTED NONE DETECTED   Opiate, Ur Screen NONE DETECTED NONE DETECTED   Phencyclidine (PCP) Ur S NONE DETECTED NONE DETECTED   Cannabinoid 50 Ng, Ur [place] NONE DETECTED NONE DETECTED   Barbiturates, Ur Screen NONE DETECTED NONE DETECTED   Benzodiazepine, Ur Scrn NONE DETECTED NONE DETECTED   Methadone Scn, Ur NONE DETECTED NONE DETECTED  Comprehensive metabolic panel   Collection Time: 08/20/16  6:32 PM  Result Value Ref Range   Sodium 137 135 - 145 mmol/L   Potassium 3.1 (L) 3.5 - 5.1 mmol/L   Chloride 108 101 - 111 mmol/L   CO2 24 22 - 32 mmol/L   Glucose, Bld 96 65 - 99 mg/dL   BUN <5 (L) 6 - 20 mg/dL   Creatinine, Ser 1.610.45 0.44 - 1.00 mg/dL   Calcium 8.8 (L) 8.9 - 10.3 mg/dL   Total Protein 5.9 (L) 6.5 - 8.1 g/dL   Albumin 2.7 (L) 3.5 - 5.0 g/dL   AST 24 15 - 41 U/L   ALT 13 (L) 14 - 54 U/L   Alkaline Phosphatase 122 38 - 126 U/L   Total Bilirubin 0.5 0.3 - 1.2 mg/dL   GFR calc non Af Amer >60 >60 mL/min    GFR calc Af Amer >60 >60 mL/min   Anion gap 5 5 - 15  CBC with Differential/Platelet   Collection Time: 08/20/16  6:32 PM  Result Value Ref Range   WBC 9.8 3.6 - 11.0 K/uL   RBC 4.09 3.80 - 5.20 MIL/uL   Hemoglobin 11.8 (L) 12.0 - 16.0 g/dL   HCT 09.634.7 (L) 04.535.0 - 40.947.0 %   MCV 84.8 80.0 - 100.0 fL   MCH 28.9 26.0 - 34.0 pg   MCHC 34.0 32.0 - 36.0 g/dL   RDW 81.113.8 91.411.5 - 78.214.5 %   Platelets 161 150 - 440 K/uL   Neutrophils Relative % 78 %   Neutro Abs 7.6 (H) 1.4 - 6.5 K/uL   Lymphocytes Relative 17 %   Lymphs Abs 1.7 1.0 - 3.6 K/uL   Monocytes Relative 5 %   Monocytes Absolute 0.5 0.2 - 0.9 K/uL   Eosinophils Relative 0 %   Eosinophils Absolute 0.0 0 - 0.7 K/uL   Basophils Relative 0 %   Basophils Absolute 0.0 0 - 0.1 K/uL  Uric acid   Collection Time: 08/20/16  6:32 PM  Result  Value Ref Range   Uric Acid, Serum 3.6 2.3 - 6.6 mg/dL     Growth ultrasound pending: Images reviewed  Gen: NAD, AAOx3      Abd: FNTTP      Ext: Non-tender, Nonedmeatous  No ankle clonus, 0-1 reflexes   FHT: 145, mod var, +accels, no decels -  TOCO: intermittent contractions, no felt by patient SVE:  ft, 30 and high  NST: Category I strip, see detailed evaluation above  A/P:  40 y.o. Jasmine Pruitt 5830w2d with gestational hypertenison                       Labor: not present.   R/o PreEclampsia: Completely asx, labs normal, no protein in urine. Negative drug screen. However, her blood pressures are elevated into high mild range. She needs repeat BP monitoring 2x weekly, with fetal surveillance to include AFI. She was given strict PreE precautions and fetal movement counts daily. She was not given steroids but scheduled for IOL at 38wks per guidelines. This might change if clinical progression of her gHTN.  Growth scan today reassuring with 40% and AFI of 8 with a 2x2 pocket. This is low but not oligo. I recommend a weekly AFI.  Fetal Wellbeing: Reassuring Cat 1 tracing.  D/c home stable,  precautions reviewed, follow-up as scheduled.

## 2016-08-20 NOTE — OB Triage Provider Note (Signed)
TRIAGE VISIT with NST   Jasmine Pruitt is a 40 y.o. [id number]. She is at 6844w2d gestation. Pt of the health department.  Indication: Elevated BP in the office in mild range to 158/89. No headache, swelling, SOB, RUQ pain.  Preg c/b: 1- grandmultip 2. Measuring small for dates- 29cm fundal height 3. Other children with Hirschsprungs' dx, normal anatomy scan 4. Hx of THC use in pregnancy 5. AMA  S: Resting comfortably. no CTX, no VB. Active fetal movement.  O:  LMP 10/25/2015 (LMP Unknown)  No results found for this or any previous visit (from the past 48 hour(s)).   Gen: NAD, AAOx3      Abd: FNTTP      Ext: Non-tender, Nonedmeatous    FHT:145, mod var +decels, no decels TOCO: quiet SVE:     A/P:  40 y.o. [id number] 6644w2d with elevated BP in office, initially Cat II strip  Labor: not present.  Start PreE workup. Serial BP. Labs. Continuous fetal monitoring Growth scan and AFI Reassess after exams and labs

## 2016-08-21 ENCOUNTER — Other Ambulatory Visit: Payer: Self-pay | Admitting: Advanced Practice Midwife

## 2016-08-21 DIAGNOSIS — Z3689 Encounter for other specified antenatal screening: Secondary | ICD-10-CM

## 2016-08-21 LAB — OB RESULTS CONSOLE GC/CHLAMYDIA
Chlamydia: NEGATIVE
Gonorrhea: NEGATIVE

## 2016-08-22 ENCOUNTER — Inpatient Hospital Stay
Admission: EM | Admit: 2016-08-22 | Discharge: 2016-08-22 | Disposition: A | Discharge status: Home / Self Care | Payer: BC Managed Care – PPO | Attending: Obstetrics and Gynecology | Admitting: Obstetrics and Gynecology

## 2016-08-22 DIAGNOSIS — O99412 Diseases of the circulatory system complicating pregnancy, second trimester: Secondary | ICD-10-CM | POA: Diagnosis not present

## 2016-08-22 DIAGNOSIS — O162 Unspecified maternal hypertension, second trimester: Secondary | ICD-10-CM | POA: Insufficient documentation

## 2016-08-22 DIAGNOSIS — O09523 Supervision of elderly multigravida, third trimester: Secondary | ICD-10-CM

## 2016-08-22 DIAGNOSIS — Z3A20 20 weeks gestation of pregnancy: Secondary | ICD-10-CM | POA: Diagnosis not present

## 2016-08-22 DIAGNOSIS — R011 Cardiac murmur, unspecified: Secondary | ICD-10-CM | POA: Diagnosis not present

## 2016-08-22 DIAGNOSIS — O09512 Supervision of elderly primigravida, second trimester: Secondary | ICD-10-CM | POA: Diagnosis present

## 2016-08-22 LAB — CBC
HEMATOCRIT: 35.2 % (ref 35.0–47.0)
Hemoglobin: 12 g/dL (ref 12.0–16.0)
MCH: 28.8 pg (ref 26.0–34.0)
MCHC: 34 g/dL (ref 32.0–36.0)
MCV: 84.8 fL (ref 80.0–100.0)
PLATELETS: 141 10*3/uL — AB (ref 150–440)
RBC: 4.15 MIL/uL (ref 3.80–5.20)
RDW: 13.9 % (ref 11.5–14.5)
WBC: 9.4 10*3/uL (ref 3.6–11.0)

## 2016-08-22 LAB — COMPREHENSIVE METABOLIC PANEL
ALBUMIN: 2.8 g/dL — AB (ref 3.5–5.0)
ALT: 12 U/L — AB (ref 14–54)
AST: 24 U/L (ref 15–41)
Alkaline Phosphatase: 129 U/L — ABNORMAL HIGH (ref 38–126)
Anion gap: 7 (ref 5–15)
BUN: 5 mg/dL — AB (ref 6–20)
CHLORIDE: 104 mmol/L (ref 101–111)
CO2: 23 mmol/L (ref 22–32)
CREATININE: 0.54 mg/dL (ref 0.44–1.00)
Calcium: 8.6 mg/dL — ABNORMAL LOW (ref 8.9–10.3)
GFR calc Af Amer: >60 mL/min (ref >60)
GFR calc non Af Amer: >60 mL/min (ref >60)
Glucose, Bld: 139 mg/dL — ABNORMAL HIGH (ref 65–99)
POTASSIUM: 2.9 mmol/L — AB (ref 3.5–5.1)
SODIUM: 134 mmol/L — AB (ref 135–145)
Total Bilirubin: 0.9 mg/dL (ref 0.3–1.2)
Total Protein: 5.9 g/dL — ABNORMAL LOW (ref 6.5–8.1)

## 2016-08-22 LAB — POC URINE PROTEIN (ARMC MANUAL ENTRY)

## 2016-08-22 LAB — PROTEIN / CREATININE RATIO, URINE
Creatinine, Urine: 101 mg/dL
PROTEIN CREATININE RATIO: 0.13 mg/mg{creat} (ref 0.00–0.15)
Total Protein, Urine: 13 mg/dL

## 2016-08-22 LAB — OB RESULTS CONSOLE GBS: GBS: NEGATIVE

## 2016-08-22 MED ORDER — LABETALOL HCL 5 MG/ML IV SOLN: 20.0000 mg | INTRAVENOUS | Status: DC | PRN

## 2016-08-22 MED ORDER — MORPHINE SULFATE (PF) 4 MG/ML IV SOLN
INTRAVENOUS | Status: AC
  Filled 2016-08-22: qty 1

## 2016-08-22 MED ORDER — NIFEDIPINE 10 MG PO CAPS
ORAL_CAPSULE | ORAL | Status: AC
  Filled 2016-08-22: qty 1

## 2016-08-22 NOTE — OB Triage Note (Signed)
Russie was seen at the health department today for a prenatal visit and was then sent to L&D for observation. Eveny states she has been going every 2 days to the health department for blood pressure checks and today she had 2+ protein in her urine. Baby has been moving normally, and she denies any LOF, vaginal bleeding, visual changes, headaches RUQ pain.  Denies any complications r/t blood pressure with previous pregnancies.

## 2016-08-22 NOTE — H&P (Signed)
Jasmine SimpsonDekida Laureen Jasmine Pruitt is a 40 y.o. female presenting for elevated BP's at [facility] today. No blurred vision, no abd pain or headache complaints. Pt is AMA.  G10 [id number] with LMP of 11/21/15 & EDD of 09/17/16 and US EDD of 08/27/16. US at 20 weeks with EDD of 09/17/16.  OB History    Gravida Para Term Preterm AB Living   10 6 6  0 3 6   SAB TAB Ectopic Multiple Live Births                 Past Medical History:  Diagnosis Date  . Heart murmur   . Medical history non-contributory    Past Surgical History:  Procedure Laterality Date  . NO PAST SURGERIES     Family History: family history includes Diabetes in her mother; Hypertension in her father. Social History:  reports that she has never smoked. She has never used smokeless tobacco. She reports that she does not drink alcohol or use drugs.  Genetic: Hirschspring's disease in 2 children. Normal fetal bowel on US.   Maternal Diabetes: 1 h GCT 75 Genetic Screening: normal bowel seen on fetus  Maternal Ultrasounds/Referrals:  Fetal Ultrasounds or other Referrals:  US showed normal bowel on fetus Maternal Substance Abuse: +MJ Significant Maternal Medications:  none Significant Maternal Lab Results:  P:C ratio  Other Comments: BP stable   Review of Systems  Constitutional: Negative.   HENT: Negative.   Eyes: Negative.   Respiratory: Negative.   Cardiovascular: Negative.   Gastrointestinal: Negative.   Genitourinary: Negative.   Musculoskeletal: Negative.   Skin: Negative.   Neurological: Negative.   Endo/Heme/Allergies: Negative.   Psychiatric/Behavioral: Negative.    History   Blood pressure 136/87, pulse 79, height 5\' 9"  (1.753 m), weight 78.1 kg (172 lb 3.2 oz), last menstrual period 10/25/2015. Exam Physical Exam  Prenatal labs: ABO, Rh:  O pos Antibody:  neg  Rubella:  Immune RPR:   NR HBsAg:   Neg  HIV:   NR GBS:   unknown Varicella immune Assessment/Plan: A: 1. IUP at 36 2/7  weeks  2. Substance Abuse 3. BP normalized while  here in Birthplace P: PC ratio: 0.13  2. NST reactive with 2 accels 15 x 15 BPM  Sharee Pimplearon W [name] 08/22/2016, 11:29 AM

## 2016-08-23 LAB — PROTEIN, URINE, 24 HOUR
Collection Interval-UPROT: 24 hours
PROTEIN, URINE: 7 mg/dL
Protein, 24H Urine: 119 mg/d — ABNORMAL HIGH (ref 50–100)
URINE TOTAL VOLUME-UPROT: 1700 mL

## 2016-08-25 ENCOUNTER — Encounter: Payer: Self-pay | Admitting: *Deleted

## 2016-08-25 ENCOUNTER — Other Ambulatory Visit: Payer: Self-pay | Admitting: *Deleted

## 2016-08-25 ENCOUNTER — Inpatient Hospital Stay
Admission: EM | Admit: 2016-08-25 | Discharge: 2016-08-25 | Disposition: A | Discharge status: Home / Self Care | Payer: BC Managed Care – PPO | Source: Home / Self Care | Admitting: Obstetrics and Gynecology

## 2016-08-25 DIAGNOSIS — O133 Gestational [pregnancy-induced] hypertension without significant proteinuria, third trimester: Secondary | ICD-10-CM

## 2016-08-25 DIAGNOSIS — O09529 Supervision of elderly multigravida, unspecified trimester: Secondary | ICD-10-CM

## 2016-08-25 DIAGNOSIS — Z3A36 36 weeks gestation of pregnancy: Secondary | ICD-10-CM | POA: Insufficient documentation

## 2016-08-25 DIAGNOSIS — O09523 Supervision of elderly multigravida, third trimester: Secondary | ICD-10-CM

## 2016-08-25 HISTORY — DX: Anemia, unspecified: D64.9

## 2016-08-25 HISTORY — DX: Essential (primary) hypertension: I10

## 2016-08-25 NOTE — Discharge Summary (Signed)
  Patient ID: Jasmine Pruitt Marcano, female   DOB: 08/20/1976, 40 y.o.   MRN: [id number] te Jasmine Pruitt [place] 06/23/1976 G1 P0  2966w5d presents for evaluation of BP in office at [facility] . 160/90's. SHe has been seen now 3 x in the past week . 24 hr urine; 119 mg protein   noLOF , no vaginal bleeding , No h/a , change  O;BP 135/87   Pulse 78   Temp 98.1 F (36.7 C) (Oral)   Resp 16   Ht 5\' 9"  (1.753 m)   Wt 173 lb 3.2 oz (78.6 kg)   LMP 10/25/2015 (LMP Unknown)   BMI 25.58 kg/m  ABD: soft NT  CX no checked  NSTreactive NST  Labs: none A: Gestational HTN( no evidence of PIH )  , normal BP on L+D  reassurring fetal monitoring  P:cont monitoring BP  Labetalol 100 mg BID . If evidence of PIH . 37 week induce  F/up with [facility]     Electronically signed by Suzy Bouchardhomas J Blanka Rockholt, MD at 08/25/2016 11:49 AM      ED to Hosp-Admission (Current) on 08/25/2016        Detailed Report

## 2016-08-25 NOTE — Progress Notes (Signed)
Patient ID: Jasmine Pruitt, female   DOB: 08/11/1976, 40 y.o.   MRN: [id number] te Jasmine Pruitt 11/03/1975 G1 P0  5925w5d presents for evaluation of BP in office at [facility] . 160/90's. SHe has been seen now 3 x in the past week . 24 hr urine; 119 mg protein   noLOF , no vaginal bleeding , No h/a , change  O;BP 135/87   Pulse 78   Temp 98.1 F (36.7 C) (Oral)   Resp 16   Ht 5\' 9"  (1.753 m)   Wt 173 lb 3.2 oz (78.6 kg)   LMP 10/25/2015 (LMP Unknown)   BMI 25.58 kg/m  ABD: soft NT  CX no checked  NSTreactive NST  Labs: none A: Gestational HTN( no evidence of PIH )  , normal BP on L+D  reassurring fetal monitoring  P:cont monitoring BP  Labetalol 100 mg BID . If evidence of PIH . 37 week induce  F/up with [facility]

## 2016-08-25 NOTE — OB Triage Note (Signed)
Pt reports she [facility] as her BP was high today. States she has been here twice before last week for same thing

## 2016-08-27 ENCOUNTER — Observation Stay
Admission: EM | Admit: 2016-08-27 | Discharge: 2016-08-27 | Disposition: A | Discharge status: Home / Self Care | Payer: BC Managed Care – PPO | Source: Home / Self Care | Admitting: Obstetrics and Gynecology

## 2016-08-27 ENCOUNTER — Encounter: Payer: Self-pay | Admitting: *Deleted

## 2016-08-27 DIAGNOSIS — O133 Gestational [pregnancy-induced] hypertension without significant proteinuria, third trimester: Secondary | ICD-10-CM

## 2016-08-27 DIAGNOSIS — Z3A37 37 weeks gestation of pregnancy: Secondary | ICD-10-CM

## 2016-08-27 DIAGNOSIS — O09523 Supervision of elderly multigravida, third trimester: Secondary | ICD-10-CM

## 2016-08-27 LAB — PROTEIN / CREATININE RATIO, URINE
CREATININE, URINE: 83 mg/dL
PROTEIN CREATININE RATIO: 0.17 mg/mg{creat} — AB (ref 0.00–0.15)
TOTAL PROTEIN, URINE: 14 mg/dL

## 2016-08-27 NOTE — OB Triage Note (Signed)
Patient sent from [facility] for elevated BP 157/95 for evaluatioin

## 2016-08-27 NOTE — Discharge Instructions (Signed)
Please return if contractions increase with intensity, water breaks, or period like bleeding. You have induction of labor scheduled for Sunday 12/17 at 8pm.

## 2016-08-27 NOTE — Progress Notes (Signed)
Patient ID: Jasmine Pruitt, female   DOB: 12/26/1975, 40 y.o.   MRN: [id number] Jasmine MoundDekida Bellotti 01/03/1976 G10 P5 5480w0d presents for elevated BP [facility] . No vision change . No H/a  Currently on labetalol noLOF , no vaginal bleeding , O;BP (!) 143/87   Pulse 69   Temp 98.1 F (36.7 C) (Oral)   Resp 18   LMP 10/25/2015 (LMP Unknown)  max BP here today 154/86 , d/c 143/87 ABDsoft gravid  CX closed / 40$ /-3 vtx NSTreactive , 140 + accels , no decels , no CTX  Labs: neg protein / cr ratio A: Gestational htn , no evidence of PIH  P:increase labetalol to 200 mg BID and RTC 08/31/16 for induction  Precautions given until then

## 2016-08-27 NOTE — Discharge Summary (Signed)
  [name], MD  Obstetrics    [] Hide copied text [] Hover for attribution information Patient ID: Jasmine MoundDekida Pruitt, female   DOB: 07/23/1976, 40 y.o.   MRN: [id number] Jasmine MoundDekida Pruitt 08/15/1976 G10 P5 3367w0d presents [facility] elevated BP [facility] . No vision change . No H/a  Currently on labetalol noLOF , no vaginal bleeding , O;BP (!) 143/87   Pulse 69   Temp 98.1 F (36.7 C) (Oral)   Resp 18   LMP 10/25/2015 (LMP Unknown)  max BP here today 154/86 , d/c 143/87 ABDsoft gravid  CX closed / 40$ /-3 vtx NSTreactive , 140 + accels , no decels , no CTX  Labs: neg protein / cr ratio A: Gestational htn , no evidence of PIH  P:increase labetalol to 200 mg BID and RTC 08/31/16 for induction  Precautions given until then

## 2016-08-28 ENCOUNTER — Inpatient Hospital Stay
Admission: EM | Admit: 2016-08-28 | Discharge: 2016-09-01 | DRG: 774 | Disposition: A | Discharge status: Home / Self Care | Payer: BC Managed Care – PPO | Attending: Obstetrics and Gynecology | Admitting: Obstetrics and Gynecology

## 2016-08-28 ENCOUNTER — Ambulatory Visit
Admission: RE | Admit: 2016-08-28 | Discharge: 2016-08-28 | Disposition: A | Discharge status: Home / Self Care | Payer: BC Managed Care – PPO | Source: Ambulatory Visit | Attending: Maternal & Fetal Medicine | Admitting: Maternal & Fetal Medicine

## 2016-08-28 ENCOUNTER — Encounter: Payer: Self-pay | Admitting: *Deleted

## 2016-08-28 DIAGNOSIS — O1002 Pre-existing essential hypertension complicating childbirth: Secondary | ICD-10-CM | POA: Diagnosis present

## 2016-08-28 DIAGNOSIS — O09523 Supervision of elderly multigravida, third trimester: Secondary | ICD-10-CM | POA: Diagnosis present

## 2016-08-28 DIAGNOSIS — Z8249 Family history of ischemic heart disease and other diseases of the circulatory system: Secondary | ICD-10-CM

## 2016-08-28 DIAGNOSIS — Z3A37 37 weeks gestation of pregnancy: Secondary | ICD-10-CM | POA: Insufficient documentation

## 2016-08-28 DIAGNOSIS — Z3689 Encounter for other specified antenatal screening: Secondary | ICD-10-CM | POA: Insufficient documentation

## 2016-08-28 DIAGNOSIS — O114 Pre-existing hypertension with pre-eclampsia, complicating childbirth: Principal | ICD-10-CM | POA: Diagnosis present

## 2016-08-28 DIAGNOSIS — O133 Gestational [pregnancy-induced] hypertension without significant proteinuria, third trimester: Secondary | ICD-10-CM | POA: Insufficient documentation

## 2016-08-28 DIAGNOSIS — F1290 Cannabis use, unspecified, uncomplicated: Secondary | ICD-10-CM | POA: Diagnosis present

## 2016-08-28 DIAGNOSIS — O09529 Supervision of elderly multigravida, unspecified trimester: Secondary | ICD-10-CM

## 2016-08-28 DIAGNOSIS — O99324 Drug use complicating childbirth: Secondary | ICD-10-CM | POA: Diagnosis present

## 2016-08-28 DIAGNOSIS — O119 Pre-existing hypertension with pre-eclampsia, unspecified trimester: Secondary | ICD-10-CM | POA: Diagnosis present

## 2016-08-28 DIAGNOSIS — O9081 Anemia of the puerperium: Secondary | ICD-10-CM | POA: Diagnosis not present

## 2016-08-28 DIAGNOSIS — O163 Unspecified maternal hypertension, third trimester: Secondary | ICD-10-CM | POA: Diagnosis present

## 2016-08-28 DIAGNOSIS — Z833 Family history of diabetes mellitus: Secondary | ICD-10-CM | POA: Diagnosis not present

## 2016-08-28 LAB — TYPE AND SCREEN
ABO/RH(D): O POS
Antibody Screen: NEGATIVE

## 2016-08-28 LAB — COMPREHENSIVE METABOLIC PANEL
ALBUMIN: 3 g/dL — AB (ref 3.5–5.0)
ALT: 102 U/L — AB (ref 14–54)
ANION GAP: 6 (ref 5–15)
AST: 132 U/L — ABNORMAL HIGH (ref 15–41)
Alkaline Phosphatase: 153 U/L — ABNORMAL HIGH (ref 38–126)
BILIRUBIN TOTAL: 0.4 mg/dL (ref 0.3–1.2)
BUN: <5 mg/dL — ABNORMAL LOW (ref 6–20)
CALCIUM: 9.1 mg/dL (ref 8.9–10.3)
CO2: 26 mmol/L (ref 22–32)
CREATININE: 0.61 mg/dL (ref 0.44–1.00)
Chloride: 104 mmol/L (ref 101–111)
GFR calc Af Amer: >60 mL/min (ref >60)
GFR calc non Af Amer: >60 mL/min (ref >60)
GLUCOSE: 87 mg/dL (ref 65–99)
Potassium: 3.4 mmol/L — ABNORMAL LOW (ref 3.5–5.1)
Sodium: 136 mmol/L (ref 135–145)
TOTAL PROTEIN: 6.4 g/dL — AB (ref 6.5–8.1)

## 2016-08-28 LAB — CBC
HCT: 35.6 % (ref 35.0–47.0)
HCT: 36.5 % (ref 35.0–47.0)
HEMOGLOBIN: 12.1 g/dL (ref 12.0–16.0)
Hemoglobin: 12.2 g/dL (ref 12.0–16.0)
MCH: 28.5 pg (ref 26.0–34.0)
MCH: 28.7 pg (ref 26.0–34.0)
MCHC: 33.4 g/dL (ref 32.0–36.0)
MCHC: 34 g/dL (ref 32.0–36.0)
MCV: 84.2 fL (ref 80.0–100.0)
MCV: 85.3 fL (ref 80.0–100.0)
PLATELETS: 145 10*3/uL — AB (ref 150–440)
PLATELETS: 157 10*3/uL (ref 150–440)
RBC: 4.23 MIL/uL (ref 3.80–5.20)
RBC: 4.28 MIL/uL (ref 3.80–5.20)
RDW: 14.1 % (ref 11.5–14.5)
RDW: 14.1 % (ref 11.5–14.5)
WBC: 7.7 10*3/uL (ref 3.6–11.0)
WBC: 9.1 10*3/uL (ref 3.6–11.0)

## 2016-08-28 LAB — PROTEIN / CREATININE RATIO, URINE
CREATININE, URINE: 25 mg/dL
Total Protein, Urine: <6 mg/dL

## 2016-08-28 MED ORDER — BUTORPHANOL TARTRATE 1 MG/ML IJ SOLN: 1.0000 mg | INTRAMUSCULAR | Status: DC | PRN

## 2016-08-28 MED ORDER — OXYTOCIN 10 UNIT/ML IJ SOLN
INTRAMUSCULAR | Status: AC
  Filled 2016-08-28: qty 2

## 2016-08-28 MED ORDER — MISOPROSTOL 200 MCG PO TABS
ORAL_TABLET | ORAL | Status: AC
  Administered 2016-08-28: 25 ug via ORAL
  Filled 2016-08-28: qty 4

## 2016-08-28 MED ORDER — LACTATED RINGERS IV SOLN
INTRAVENOUS | Status: DC
  Administered 2016-08-28: 1000 mL via INTRAVENOUS
  Administered 2016-08-29 – 2016-08-30 (×2): via INTRAVENOUS
  Administered 2016-08-30: 100 mL/h via INTRAVENOUS

## 2016-08-28 MED ORDER — MAGNESIUM SULFATE 50 % IJ SOLN
INTRAVENOUS | Status: AC
  Administered 2016-08-28: 2 g/h via INTRAVENOUS
  Filled 2016-08-28: qty 80

## 2016-08-28 MED ORDER — LIDOCAINE HCL (PF) 1 % IJ SOLN
INTRAMUSCULAR | Status: AC
  Filled 2016-08-28: qty 30

## 2016-08-28 MED ORDER — OXYTOCIN 40 UNITS IN LACTATED RINGERS INFUSION - SIMPLE MED
1.0000 m[IU]/min | INTRAVENOUS | Status: DC
  Administered 2016-08-29: 1 m[IU]/min via INTRAVENOUS

## 2016-08-28 MED ORDER — CALCIUM GLUCONATE 10 % IV SOLN
INTRAVENOUS | Status: AC
  Filled 2016-08-28: qty 10

## 2016-08-28 MED ORDER — OXYTOCIN BOLUS FROM INFUSION
500.0000 mL | Freq: Once | INTRAVENOUS | Status: AC
  Administered 2016-08-30: 500 mL via INTRAVENOUS

## 2016-08-28 MED ORDER — LIDOCAINE HCL (PF) 1 % IJ SOLN: 30.0000 mL | INTRAMUSCULAR | Status: DC | PRN

## 2016-08-28 MED ORDER — LABETALOL HCL 5 MG/ML IV SOLN
20.0000 mg | INTRAVENOUS | Status: DC | PRN
  Filled 2016-08-28: qty 16

## 2016-08-28 MED ORDER — AMMONIA AROMATIC IN INHA
RESPIRATORY_TRACT | Status: AC
  Filled 2016-08-28: qty 10

## 2016-08-28 MED ORDER — SOD CITRATE-CITRIC ACID 500-334 MG/5ML PO SOLN: 30.0000 mL | ORAL | Status: DC | PRN

## 2016-08-28 MED ORDER — LACTATED RINGERS IV SOLN: 500.0000 mL | INTRAVENOUS | Status: DC | PRN

## 2016-08-28 MED ORDER — OXYTOCIN 40 UNITS IN LACTATED RINGERS INFUSION - SIMPLE MED
2.5000 [IU]/h | INTRAVENOUS | Status: DC
  Filled 2016-08-28 (×2): qty 1000

## 2016-08-28 MED ORDER — OXYCODONE-ACETAMINOPHEN 5-325 MG PO TABS: 2.0000 | ORAL_TABLET | ORAL | Status: DC | PRN

## 2016-08-28 MED ORDER — MAGNESIUM SULFATE BOLUS VIA INFUSION
4.0000 g | Freq: Once | INTRAVENOUS | Status: DC
  Filled 2016-08-28: qty 500

## 2016-08-28 MED ORDER — ACETAMINOPHEN 325 MG PO TABS: 650.0000 mg | ORAL_TABLET | ORAL | Status: DC | PRN

## 2016-08-28 MED ORDER — MISOPROSTOL 25 MCG QUARTER TABLET
25.0000 ug | ORAL_TABLET | ORAL | Status: DC | PRN
Start: 2016-08-28 — End: 2016-08-31
  Administered 2016-08-28 – 2016-08-29 (×4): 25 ug via ORAL
  Filled 2016-08-28: qty 0.25
  Filled 2016-08-28 (×2): qty 1
  Filled 2016-08-28: qty 0.25

## 2016-08-28 MED ORDER — ONDANSETRON HCL 4 MG/2ML IJ SOLN: 4.0000 mg | Freq: Four times a day (QID) | INTRAMUSCULAR | Status: DC | PRN

## 2016-08-28 MED ORDER — HYDRALAZINE HCL 20 MG/ML IJ SOLN
10.0000 mg | Freq: Once | INTRAMUSCULAR | Status: DC | PRN
Start: 2016-08-28 — End: 2016-09-01

## 2016-08-28 MED ORDER — TERBUTALINE SULFATE 1 MG/ML IJ SOLN: 0.2500 mg | Freq: Once | INTRAMUSCULAR | Status: DC | PRN

## 2016-08-28 MED ORDER — LABETALOL HCL 200 MG PO TABS
200.0000 mg | ORAL_TABLET | Freq: Two times a day (BID) | ORAL | Status: DC
  Administered 2016-08-28 – 2016-09-01 (×8): 200 mg via ORAL
  Filled 2016-08-28 (×8): qty 1

## 2016-08-28 MED ORDER — MAGNESIUM SULFATE 50 % IJ SOLN
2.0000 g/h | INTRAVENOUS | Status: AC
  Administered 2016-08-28 – 2016-08-30 (×3): 2 g/h via INTRAVENOUS
  Filled 2016-08-28 (×2): qty 80

## 2016-08-28 MED ORDER — MAGNESIUM SULFATE 4 GM/100ML IV SOLN
INTRAVENOUS | Status: AC
  Administered 2016-08-28: 4 g
  Filled 2016-08-28: qty 100

## 2016-08-28 NOTE — Progress Notes (Signed)
On further review of patient's chart, it is likely she has chronic hypertension:  First OB visit @ 21 weeks, BP was 140/86 10/23/15 podiatry visit 149/91 04/2014 ED visit 142/89 During pregnancy 08/2012 had SBPs of 140s, 150s   This obviously does not change her elevated transaminases in this setting, and changes her diagnosis to superimposed preeclampsia with severe features.  Continue management as planned.    ----- Ranae Plumberhelsea Calel Pisarski, MD Attending Obstetrician and Gynecologist [facility], Department of OB/GYN [facility]

## 2016-08-28 NOTE — H&P (Addendum)
duplicate

## 2016-08-28 NOTE — Progress Notes (Signed)
Intrapartum progress note:  S: no complaints, comfortable.   Denies: HA, visual changes, SOB, or RUQ/epigastric pain  O: BP (!) 152/84   Pulse 78   Temp 97.9 F (36.6 C) (Oral)   Resp 18   Ht 5\' 9"  (1.753 m)   Wt 78.9 kg (174 lb)   LMP 10/25/2015 (LMP Unknown)   BMI 25.70 kg/m    FHT:  145 mod + accels no decels TOCO: quiet  Results for orders placed or performed during the hospital encounter of 08/28/16 (from the past 24 hour(s))  CBC     Status: Abnormal   Collection Time: 08/28/16  5:25 PM  Result Value Ref Range   WBC 9.1 3.6 - 11.0 K/uL   RBC 4.28 3.80 - 5.20 MIL/uL   Hemoglobin 12.2 12.0 - 16.0 g/dL   HCT 11.936.5 14.735.0 - 82.947.0 %   MCV 85.3 80.0 - 100.0 fL   MCH 28.5 26.0 - 34.0 pg   MCHC 33.4 32.0 - 36.0 g/dL   RDW 56.214.1 13.011.5 - 86.514.5 %   Platelets 145 (L) 150 - 440 K/uL  Type and screen [facility]     Status: None   Collection Time: 08/28/16  5:25 PM  Result Value Ref Range   ABO/RH(D) O POS    Antibody Screen NEG    Sample Expiration 08/31/2016   Comprehensive metabolic panel     Status: Abnormal   Collection Time: 08/28/16  5:25 PM  Result Value Ref Range   Sodium 136 135 - 145 mmol/L   Potassium 3.4 (L) 3.5 - 5.1 mmol/L   Chloride 104 101 - 111 mmol/L   CO2 26 22 - 32 mmol/L   Glucose, Bld 87 65 - 99 mg/dL   BUN <5 (L) 6 - 20 mg/dL   Creatinine, Ser 7.840.61 0.44 - 1.00 mg/dL   Calcium 9.1 8.9 - 69.610.3 mg/dL   Total Protein 6.4 (L) 6.5 - 8.1 g/dL   Albumin 3.0 (L) 3.5 - 5.0 g/dL   AST 295132 (H) 15 - 41 U/L   ALT 102 (H) 14 - 54 U/L   Alkaline Phosphatase 153 (H) 38 - 126 U/L   Total Bilirubin 0.4 0.3 - 1.2 mg/dL   GFR calc non Af Amer >60 >60 mL/min   GFR calc Af Amer >60 >60 mL/min   Anion gap 6 5 - 15  Protein / creatinine ratio, urine     Status: None   Collection Time: 08/28/16  5:49 PM  Result Value Ref Range   Creatinine, Urine 25 mg/dL   Total Protein, Urine <6 mg/dL   Protein Creatinine Ratio        0.00 - 0.15 mg/mg[Cre]     A/P: 40yo G10 P6036 @ 37.1 with preeclampsia with severe features.  1. Preeclampsia:  With elevated liver transaminases >2x normal level, she meets criteria for severe features.  Risk of seizure elevated in this setting and prophylactic magnesium sulfate indicated.  Will start with 4g bolus then 2g/hr.  Explained this intervention to patient and husband, and their questions were answered.  Will get q6h labs to assess for worsening status. No neurologic sequelae.  These were also discussed with patient and she will notify us should any symptoms occur.  2. Preeclampsia /Magnesium sulfate protocol for vital signs, I/O observation.  OK to collect urine via hat for now; should UOP decrease will insert foley, or if she gets an epidural (planning to).   3. IUP: continues a category 1 strip  4: IOL: will continue to attempt for vaginal delivery.  Cytotec q4h until 24hrs, persistent contractions >3 in 5mins, OR cervical dilation with bishop score >8.  Will switch to pitocin if needed at that time.  5. continue inpatient admission.  ----- Ranae Plumberhelsea Pricsilla Lindvall, MD Attending Obstetrician and Gynecologist [facility], Department of OB/GYN [facility]

## 2016-08-28 NOTE — Progress Notes (Signed)
Ward, MD notified about pt's increased AST & ALT, will plan to start magnesium

## 2016-08-28 NOTE — Progress Notes (Signed)
Placed on monitor for NST.  Reported elevated BP 153/94 and patient assessment to Dr. Dolphus JennySmall.  Removed from monitor per Dr. Dolphus JennySmall and started U/S.  Rechecked BP while laying down, 149/81.  Transferring to L&D.

## 2016-08-28 NOTE — H&P (Addendum)
OB History & Physical   History of Present Illness:  Chief Complaint:   HPI:  Jasmine Pruitt is a 40 y.o. [id number] female at 54w1ddated by 224 weekUKoreawith Estimated Date of Delivery: 09/17/16 She presents to L&D for IOL due to gestational hypertension at term.  +FM, no CTX, no LOF, no VB  Pregnancy Issues: 1. Gestational hypertension dx @ term - on Labetalol 2029mBID, increased yesterday 2. 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 AMA age 89 delivery - declined genetic screening 4. Grandmultipara  5. 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 Anemia   . Heart murmur    as a child - resolved  . Hypertension     Past Surgical History:  Procedure Laterality Date  . WISDOM TOOTH EXTRACTION      Allergies  Allergen Reactions  . Sulfa Antibiotics Rash    Prior to Admission medications   Medication Sig Start Date End Date Taking? Authorizing Provider  labetalol (NORMODYNE) 100 MG tablet Take 200 mg by mouth 2 (two) times daily.  08/25/16  Yes ThGwen Herchermerhorn, MD  prenatal vitamin w/FE, FA (PRENATAL 1 + 1) 27-1 MG TABS tablet Take 1 tablet by mouth daily at 12 noon.   Yes Historical Provider, MD     Prenatal care site: [facility]  Social History: She  reports that she has never smoked. She has never used smokeless tobacco. She reports that she does not drink alcohol or use drugs.  Family History: family history includes Diabetes in her mother; Hypertension in her father.   Review of Systems: A full review of systems was performed and negative except as noted in the HPI.     Physical Exam:  Vital Signs: BP (!) 149/90   Pulse 74   Temp 98.5 F (36.9 C) (Oral)   Resp 18   Ht _0  (1.753 m)   Wt 78.9 kg (174 lb)   LMP 10/25/2015 (LMP Unknown)   BMI 25.70 kg/m  General: no acute distress.  HEENT: normocephalic, atraumatic Heart:  regular rate & rhythm.  No murmurs/rubs/gallops Lungs: clear to auscultation bilaterally, normal respiratory effort Abdomen: soft, gravid, non-tender;  EFW: 7.8 Pelvic:   External: Normal external female genitalia  Cervix: c/l/high   Extremities: non-tender, symmetric, 1+ edema bilaterally.  DTRs: 2+ Neurologic: Alert & oriented x 3.    LABS PENDING   Pertinent Results:  Prenatal Labs: Blood type/Rh O+  Antibody screen neg  Rubella S/p MMR x2  Varicella Immune  RPR NR  HBsAg Neg  HIV NR  GC neg  Chlamydia neg  Genetic screening negative  1 hour GTT 75  3 hour GTT   GBS Negative   TDAP and FLU declined  FHT: 145 mod + accels no decels TOCO: quiet SVE:  Closed/long/high   Cephalic by leopolds  Assessment:  Jasmine Pruitt a [age].o. [id number] at 3774w1dth IOL for gestational hypertension at term.   Plan:  1. Admit to Labor & Delivery 2. CBC, T&S, Clrs, IVF 3. GBS  Neg - abx not indicated 4. Consents obtained. 5. Continuous efm/toco 6. IUP: category 1 7. IOL: bishop <8, cytotec 30m40muccally q4h  8. No s/sx preeclampsia, f/u labs. Continue PO labetalol  ----- ChelLarey Days Attending Obstetrician and Gynecologist [facility]

## 2016-08-29 LAB — CBC
HCT: 35.3 % (ref 35.0–47.0)
HEMATOCRIT: 37.8 % (ref 35.0–47.0)
HEMATOCRIT: 37.1 % (ref 35.0–47.0)
HEMOGLOBIN: 12.5 g/dL (ref 12.0–16.0)
HEMOGLOBIN: 12.4 g/dL (ref 12.0–16.0)
Hemoglobin: 12 g/dL (ref 12.0–16.0)
MCH: 28.3 pg (ref 26.0–34.0)
MCH: 28.4 pg (ref 26.0–34.0)
MCH: 29 pg (ref 26.0–34.0)
MCHC: 33 g/dL (ref 32.0–36.0)
MCHC: 33.4 g/dL (ref 32.0–36.0)
MCHC: 34 g/dL (ref 32.0–36.0)
MCV: 85.7 fL (ref 80.0–100.0)
MCV: 85.1 fL (ref 80.0–100.0)
MCV: 85.2 fL (ref 80.0–100.0)
PLATELETS: 161 10*3/uL (ref 150–440)
Platelets: 154 10*3/uL (ref 150–440)
Platelets: 159 10*3/uL (ref 150–440)
RBC: 4.41 MIL/uL (ref 3.80–5.20)
RBC: 4.35 MIL/uL (ref 3.80–5.20)
RBC: 4.15 MIL/uL (ref 3.80–5.20)
RDW: 14.3 % (ref 11.5–14.5)
RDW: 14.1 % (ref 11.5–14.5)
RDW: 14.1 % (ref 11.5–14.5)
WBC: 7.7 10*3/uL (ref 3.6–11.0)
WBC: 8.4 10*3/uL (ref 3.6–11.0)
WBC: 9.2 10*3/uL (ref 3.6–11.0)

## 2016-08-29 LAB — COMPREHENSIVE METABOLIC PANEL
ALBUMIN: 2.9 g/dL — AB (ref 3.5–5.0)
ALBUMIN: 2.9 g/dL — AB (ref 3.5–5.0)
ALBUMIN: 2.9 g/dL — AB (ref 3.5–5.0)
ALK PHOS: 154 U/L — AB (ref 38–126)
ALK PHOS: 157 U/L — AB (ref 38–126)
ALT: 103 U/L — ABNORMAL HIGH (ref 14–54)
ALT: 104 U/L — ABNORMAL HIGH (ref 14–54)
ALT: 97 U/L — ABNORMAL HIGH (ref 14–54)
ALT: 105 U/L — AB (ref 14–54)
ANION GAP: 5 (ref 5–15)
ANION GAP: 3 — AB (ref 5–15)
AST: 108 U/L — AB (ref 15–41)
AST: 114 U/L — ABNORMAL HIGH (ref 15–41)
AST: 122 U/L — ABNORMAL HIGH (ref 15–41)
AST: 91 U/L — ABNORMAL HIGH (ref 15–41)
Albumin: 3 g/dL — ABNORMAL LOW (ref 3.5–5.0)
Alkaline Phosphatase: 149 U/L — ABNORMAL HIGH (ref 38–126)
Alkaline Phosphatase: 149 U/L — ABNORMAL HIGH (ref 38–126)
Anion gap: 3 — ABNORMAL LOW (ref 5–15)
Anion gap: 7 (ref 5–15)
BILIRUBIN TOTAL: 0.3 mg/dL (ref 0.3–1.2)
BILIRUBIN TOTAL: 0.4 mg/dL (ref 0.3–1.2)
BILIRUBIN TOTAL: 0.5 mg/dL (ref 0.3–1.2)
BILIRUBIN TOTAL: 0.5 mg/dL (ref 0.3–1.2)
BUN: 5 mg/dL — ABNORMAL LOW (ref 6–20)
BUN: <5 mg/dL — ABNORMAL LOW (ref 6–20)
BUN: <5 mg/dL — ABNORMAL LOW (ref 6–20)
CALCIUM: 7.4 mg/dL — AB (ref 8.9–10.3)
CALCIUM: 8.2 mg/dL — AB (ref 8.9–10.3)
CHLORIDE: 109 mmol/L (ref 101–111)
CHLORIDE: 105 mmol/L (ref 101–111)
CO2: 24 mmol/L (ref 22–32)
CO2: 24 mmol/L (ref 22–32)
CO2: 25 mmol/L (ref 22–32)
CO2: 23 mmol/L (ref 22–32)
CREATININE: 0.56 mg/dL (ref 0.44–1.00)
CREATININE: 0.58 mg/dL (ref 0.44–1.00)
Calcium: 7.7 mg/dL — ABNORMAL LOW (ref 8.9–10.3)
Calcium: 7.4 mg/dL — ABNORMAL LOW (ref 8.9–10.3)
Chloride: 105 mmol/L (ref 101–111)
Chloride: 105 mmol/L (ref 101–111)
Creatinine, Ser: 0.57 mg/dL (ref 0.44–1.00)
Creatinine, Ser: 0.58 mg/dL (ref 0.44–1.00)
GFR calc Af Amer: >60 mL/min (ref >60)
GFR calc Af Amer: >60 mL/min (ref >60)
GFR calc Af Amer: >60 mL/min (ref >60)
GFR calc non Af Amer: >60 mL/min (ref >60)
GFR calc non Af Amer: >60 mL/min (ref >60)
GFR calc non Af Amer: >60 mL/min (ref >60)
GLUCOSE: 107 mg/dL — AB (ref 65–99)
GLUCOSE: 84 mg/dL (ref 65–99)
GLUCOSE: 89 mg/dL (ref 65–99)
Glucose, Bld: 105 mg/dL — ABNORMAL HIGH (ref 65–99)
POTASSIUM: 3.4 mmol/L — AB (ref 3.5–5.1)
POTASSIUM: 3.6 mmol/L (ref 3.5–5.1)
Potassium: 3.2 mmol/L — ABNORMAL LOW (ref 3.5–5.1)
Potassium: 3.6 mmol/L (ref 3.5–5.1)
SODIUM: 135 mmol/L (ref 135–145)
Sodium: 138 mmol/L (ref 135–145)
Sodium: 132 mmol/L — ABNORMAL LOW (ref 135–145)
Sodium: 133 mmol/L — ABNORMAL LOW (ref 135–145)
TOTAL PROTEIN: 6.1 g/dL — AB (ref 6.5–8.1)
TOTAL PROTEIN: 6.2 g/dL — AB (ref 6.5–8.1)
TOTAL PROTEIN: 6.4 g/dL — AB (ref 6.5–8.1)
TOTAL PROTEIN: 6.5 g/dL (ref 6.5–8.1)

## 2016-08-29 LAB — RPR: RPR Ser Ql: NONREACTIVE

## 2016-08-29 MED ORDER — DINOPROSTONE 10 MG VA INST
10.0000 mg | VAGINAL_INSERT | Freq: Once | VAGINAL | Status: AC
  Administered 2016-08-29: 10 mg via VAGINAL
  Filled 2016-08-29: qty 1

## 2016-08-29 MED ORDER — TERBUTALINE SULFATE 1 MG/ML IJ SOLN: 0.2500 mg | Freq: Once | INTRAMUSCULAR | Status: DC | PRN

## 2016-08-29 MED ORDER — RANITIDINE NICU IV SYRINGE 25 MG/ML: 50.0000 mg | INJECTION | Freq: Two times a day (BID) | INTRAMUSCULAR | Status: DC | PRN

## 2016-08-29 MED ORDER — ZOLPIDEM TARTRATE 5 MG PO TABS: 5.0000 mg | ORAL_TABLET | Freq: Every evening | ORAL | Status: DC | PRN

## 2016-08-29 NOTE — Progress Notes (Signed)
Jasmine SimpsonDekida Laureen AbrahamsDoster is a 40 y.o. [id number] at 6417w2d by cytotec  Induction of PIH with severe features  With mildly elevated transaminases  Currently on Magnesium 2 gm/ hr .  Subjective: No h/a , no vision change   Objective: BP (!) 142/86   Pulse 76   Temp 98.2 F (36.8 C) (Oral)   Resp 18   Ht 5\' 9"  (1.753 m)   Wt 174 lb (78.9 kg)   LMP 10/25/2015 (LMP Unknown)   SpO2 100%   BMI 25.70 kg/m  I/O last 3 completed shifts: In: -  Out: 2830 [Urine:2830] Total I/O In: 1455.8 [P.O.:1080; I.V.:375.8] Out: 750 [Urine:750]  FHT:  FHR: 140 bpm, variability: minimal ,  accelerations:  Present,  decelerations:  Absent UC:   none SVE:      Labs: Lab Results  Component Value Date   WBC 8.4 08/29/2016   HGB 12.4 08/29/2016   HCT 37.1 08/29/2016   MCV 85.1 08/29/2016   PLT 159 08/29/2016    Assessment / Plan: Preeclampsia  On Magnesium  BP stable  No ctx on buccal cytotec.  Will switch to pitocin

## 2016-08-29 NOTE — Progress Notes (Addendum)
Patient ID: Jasmine Pruitt, female   DOB: 12/02/1975, 40 y.o.   MRN: [id number] Pt on pitocin and having regular mild ctx . No ROM . No c/o  Reassuring fetal monitoring  BP 150/87 CX 1cm / 80 / -3  DTR normal   good UO  A: PIH with elevated LFT Reassuring fetal monitoring P: d/c pit and place cervidil overnight  Repeat labs in am

## 2016-08-30 ENCOUNTER — Encounter: Payer: Self-pay | Admitting: Anesthesiology

## 2016-08-30 ENCOUNTER — Inpatient Hospital Stay: Payer: BC Managed Care – PPO | Admitting: Anesthesiology

## 2016-08-30 LAB — CBC
HCT: 36.6 % (ref 35.0–47.0)
Hemoglobin: 12.3 g/dL (ref 12.0–16.0)
MCH: 28.8 pg (ref 26.0–34.0)
MCHC: 33.7 g/dL (ref 32.0–36.0)
MCV: 85.3 fL (ref 80.0–100.0)
PLATELETS: 213 10*3/uL (ref 150–440)
RBC: 4.29 MIL/uL (ref 3.80–5.20)
RDW: 13.8 % (ref 11.5–14.5)
WBC: 13.1 10*3/uL — ABNORMAL HIGH (ref 3.6–11.0)

## 2016-08-30 LAB — COMPREHENSIVE METABOLIC PANEL
ALK PHOS: 151 U/L — AB (ref 38–126)
ALT: 80 U/L — AB (ref 14–54)
AST: 68 U/L — AB (ref 15–41)
Albumin: 2.9 g/dL — ABNORMAL LOW (ref 3.5–5.0)
Anion gap: 6 (ref 5–15)
BILIRUBIN TOTAL: 0.3 mg/dL (ref 0.3–1.2)
CALCIUM: 7.6 mg/dL — AB (ref 8.9–10.3)
CO2: 23 mmol/L (ref 22–32)
CREATININE: 0.49 mg/dL (ref 0.44–1.00)
Chloride: 106 mmol/L (ref 101–111)
GFR calc Af Amer: >60 mL/min (ref >60)
Glucose, Bld: 91 mg/dL (ref 65–99)
POTASSIUM: 3.8 mmol/L (ref 3.5–5.1)
Sodium: 135 mmol/L (ref 135–145)
TOTAL PROTEIN: 6.3 g/dL — AB (ref 6.5–8.1)

## 2016-08-30 LAB — MAGNESIUM: MAGNESIUM: 4.5 mg/dL — AB (ref 1.7–2.4)

## 2016-08-30 MED ORDER — LIDOCAINE HCL (PF) 1 % IJ SOLN
INTRAMUSCULAR | Status: DC | PRN
  Administered 2016-08-30: 1 mL via INTRADERMAL

## 2016-08-30 MED ORDER — SODIUM CHLORIDE 0.9% FLUSH: 3.0000 mL | INTRAVENOUS | Status: DC | PRN

## 2016-08-30 MED ORDER — PRENATAL MULTIVITAMIN CH
1.0000 | ORAL_TABLET | Freq: Every day | ORAL | Status: DC
  Administered 2016-08-31 – 2016-09-01 (×2): 1 via ORAL
  Filled 2016-08-30 (×3): qty 1

## 2016-08-30 MED ORDER — FENTANYL 2.5 MCG/ML W/ROPIVACAINE 0.2% IN NS 100 ML EPIDURAL INFUSION (ARMC-ANES)
EPIDURAL | Status: AC
  Filled 2016-08-30: qty 100

## 2016-08-30 MED ORDER — FERROUS SULFATE 325 (65 FE) MG PO TABS
325.0000 mg | ORAL_TABLET | Freq: Two times a day (BID) | ORAL | Status: DC
  Administered 2016-08-31 – 2016-09-01 (×3): 325 mg via ORAL
  Filled 2016-08-30 (×3): qty 1

## 2016-08-30 MED ORDER — COCONUT OIL OIL: 1.0000 "application " | TOPICAL_OIL | Status: DC | PRN

## 2016-08-30 MED ORDER — SODIUM CHLORIDE 0.9 % IV SOLN
INTRAVENOUS | Status: DC | PRN
  Administered 2016-08-30 (×2): 5 mL via EPIDURAL

## 2016-08-30 MED ORDER — NALBUPHINE HCL 10 MG/ML IJ SOLN: 5.0000 mg | INTRAMUSCULAR | Status: DC | PRN

## 2016-08-30 MED ORDER — DIBUCAINE 1 % RE OINT: 1.0000 "application " | TOPICAL_OINTMENT | RECTAL | Status: DC | PRN

## 2016-08-30 MED ORDER — NALOXONE HCL 0.4 MG/ML IJ SOLN: 0.4000 mg | INTRAMUSCULAR | Status: DC | PRN

## 2016-08-30 MED ORDER — FENTANYL 2.5 MCG/ML W/ROPIVACAINE 0.2% IN NS 100 ML EPIDURAL INFUSION (ARMC-ANES)
EPIDURAL | Status: DC | PRN
  Administered 2016-08-30: 10 mL/h via EPIDURAL

## 2016-08-30 MED ORDER — OXYCODONE-ACETAMINOPHEN 5-325 MG PO TABS: 1.0000 | ORAL_TABLET | ORAL | Status: DC | PRN

## 2016-08-30 MED ORDER — DIPHENHYDRAMINE HCL 25 MG PO CAPS: 25.0000 mg | ORAL_CAPSULE | Freq: Four times a day (QID) | ORAL | Status: DC | PRN

## 2016-08-30 MED ORDER — NALOXONE HCL 2 MG/2ML IJ SOSY
1.0000 ug/kg/h | PREFILLED_SYRINGE | INTRAVENOUS | Status: DC | PRN
  Filled 2016-08-30: qty 2

## 2016-08-30 MED ORDER — ACETAMINOPHEN 325 MG PO TABS: 650.0000 mg | ORAL_TABLET | ORAL | Status: DC | PRN

## 2016-08-30 MED ORDER — METHYLERGONOVINE MALEATE 0.2 MG/ML IJ SOLN
INTRAMUSCULAR | Status: AC
  Filled 2016-08-30: qty 1

## 2016-08-30 MED ORDER — NALBUPHINE HCL 10 MG/ML IJ SOLN: 5.0000 mg | Freq: Once | INTRAMUSCULAR | Status: DC | PRN

## 2016-08-30 MED ORDER — SENNOSIDES-DOCUSATE SODIUM 8.6-50 MG PO TABS
2.0000 | ORAL_TABLET | ORAL | Status: DC
  Administered 2016-09-01: 2 via ORAL
  Filled 2016-08-30: qty 2

## 2016-08-30 MED ORDER — ONDANSETRON HCL 4 MG PO TABS: 4.0000 mg | ORAL_TABLET | ORAL | Status: DC | PRN

## 2016-08-30 MED ORDER — DIPHENHYDRAMINE HCL 50 MG/ML IJ SOLN: 12.5000 mg | INTRAMUSCULAR | Status: DC | PRN

## 2016-08-30 MED ORDER — LIDOCAINE-EPINEPHRINE (PF) 1.5 %-1:200000 IJ SOLN
INTRAMUSCULAR | Status: DC | PRN
  Administered 2016-08-30: 3 mL via EPIDURAL

## 2016-08-30 MED ORDER — WITCH HAZEL-GLYCERIN EX PADS: 1.0000 "application " | MEDICATED_PAD | CUTANEOUS | Status: DC | PRN

## 2016-08-30 MED ORDER — ZOLPIDEM TARTRATE 5 MG PO TABS: 5.0000 mg | ORAL_TABLET | Freq: Every evening | ORAL | Status: DC | PRN

## 2016-08-30 MED ORDER — IBUPROFEN 600 MG PO TABS
600.0000 mg | ORAL_TABLET | Freq: Four times a day (QID) | ORAL | Status: DC
  Administered 2016-08-31 – 2016-09-01 (×3): 600 mg via ORAL
  Filled 2016-08-30 (×4): qty 1

## 2016-08-30 MED ORDER — ONDANSETRON HCL 4 MG/2ML IJ SOLN: 4.0000 mg | INTRAMUSCULAR | Status: DC | PRN

## 2016-08-30 MED ORDER — FENTANYL 2.5 MCG/ML W/ROPIVACAINE 0.2% IN NS 100 ML EPIDURAL INFUSION (ARMC-ANES): 10.0000 mL/h | EPIDURAL | Status: DC

## 2016-08-30 MED ORDER — DIPHENHYDRAMINE HCL 25 MG PO CAPS: 25.0000 mg | ORAL_CAPSULE | ORAL | Status: DC | PRN

## 2016-08-30 MED ORDER — OXYCODONE-ACETAMINOPHEN 5-325 MG PO TABS: 2.0000 | ORAL_TABLET | ORAL | Status: DC | PRN

## 2016-08-30 MED ORDER — SIMETHICONE 80 MG PO CHEW: 80.0000 mg | CHEWABLE_TABLET | ORAL | Status: DC | PRN

## 2016-08-30 MED ORDER — BENZOCAINE-MENTHOL 20-0.5 % EX AERO: 1.0000 "application " | INHALATION_SPRAY | CUTANEOUS | Status: DC | PRN

## 2016-08-30 NOTE — Progress Notes (Signed)
S:  Pt. Uncomfortable with contractions and requesting epidural      SROM for pink-tinged fluid at 11:20      On 4 milliunits of Pitocin  O:  VS: Blood pressure (!) 160/90, pulse 66, temperature 98.8 F (37.1 C), temperature source Oral, resp. rate 18, height 5\' 9"  (1.753 m), weight 78.9 kg (174 lb), last menstrual period 10/25/2015, SpO2 99 %.        FHR : baseline 135 bpm / variability moderate / accelerations + / occasional early and variable decelerations        Toco: contractions every 3 minutes /moderate        Cervix : Dilation: 1.5 Effacement (%): 80 Cervical Position: Posterior Station: -3 Presentation: Vertex Exam by:: M T RN        Membranes: SROM - pink fluid  A: Latent labor     FHR category 2     CHTN with superimposed pre-eclampsia   P: Anticipate NSVD  Carlean JewsMeredith Sigmon, CNM

## 2016-08-30 NOTE — Discharge Summary (Signed)
Obstetrical Discharge Summary  Patient Name: Jasmine Pruitt DOB: November 27, 1975 MRN: [id number]  Date of Admission: 08/28/2016 Date of Discharge:  Primary OB:  [facility]  Gestational Age at Delivery: [redacted]w[redacted]d  Antepartum complications: Chronic HTN with superimposed pre-eclampsia  Admitting Diagnosis: Chronic HTN with superimposed pre-eclampsia  Secondary Diagnosis: Patient Active Problem List   Diagnosis Date Noted  . Hypertension affecting pregnancy in third trimester 08/28/2016  . Chronic hypertension with superimposed pre-eclampsia 08/28/2016  . Chronic hypertension affecting pregnancy 08/27/2016  . Labor and delivery indication for care or intervention 06/23/2016  . Advanced maternal age in multigravida 05/01/2016    Induction: Buccal Cytotec, followed by IV Pitocin, then Cervidil Augmentation: Pitocin Complications: Chronic HTN with superimposed pre-eclampsia Requiring Magnesium Sulfate  Intrapartum complications/course: [facility] and was started on IV Magnesium Sulfate.  She is also on PO labetalol for CHTN.  She was induced with Buccal Cytotec, followed by IV Pitocin, then Cervidil.  After cervidil was removed on 08/30/16, membranes stripped, and Pitocin restarted.  She spontaneously ruptured her membranes at 1120. She progressed from 2cm at 1120 to complete at 1321 after SROM.  She pushed 3 times with delivery of a viable female at 1104by MLars Pinks CNM in LOA position.  She had a loose nuchal cord reduced over head.  Baby was placed on mom's abdomen, vigorous, and dried. The cord double clamped after cessation of pulsation, cut by FOB. Cord blood sample collected. Placenta delivered via SDelena Baliintact with trailing membranes I teased out using ring forceps with a 3 VC @ 1412 Placenta disposition: surgical pathology  Uterine tone firm / bleeding minimal Date of Delivery: 08/30/16 Delivered By:  MLars Pinks CNM  Delivery Type: spontaneous vaginal delivery Anesthesia: epidural Placenta: sponatneous Laceration: none Episiotomy: none Newborn Data: Live born female  Birth Weight: 5 lb 8.5 oz (2510 g) APGAR: 8, 9  Postpartum Procedures: 24 hour PP Mag  Post partum course:  Patient's postpartum course [facility] requiring magnesium sulfate during labor and 24 hours of magnesium PP.  By time of discharge on PPD#2, her pain was controlled on oral pain medications; she had appropriate lochia and was ambulating, voiding without difficulty and tolerating regular diet.  She was deemed stable for discharge to home.    Discharge Physical Exam:  BP (!) 148/84   Pulse 66   Temp 98.4 F (36.9 C) (Oral)   Resp 18   Ht '5\' 9"'  (1.753 m)   Wt 78.9 kg (174 lb)   LMP 10/25/2015 (LMP Unknown)   SpO2 100%   Breastfeeding? Unknown   BMI 25.70 kg/m   General: NAD CV: RRR Pulm: CTABL, nl effort ABD: s/nd/nt, fundus firm and below the umbilicus Lochia: moderate Incision: c/d/i DVT Evaluation: LE non-ttp, no evidence of DVT on exam.  Hemoglobin  Date Value Ref Range Status  08/30/2016 12.3 12.0 - 16.0 g/dL Final   HCT  Date Value Ref Range Status  08/30/2016 36.6 35.0 - 47.0 % Final     Disposition: stable, discharge to home. Baby Feeding: formula Baby Disposition: home with mom  Rh Immune globulin given: N/A Rubella vaccine given: N/A Tdap vaccine given in AP or PP setting: Declined  Flu vaccine given in AP or PP setting: Declined  Contraception: Depo Prior to Discharge  Prenatal Labs:  Prenatal Labs: Blood type/Rh O+  Antibody screen neg  Rubella S/p MMR x2  Varicella Immune  RPR NR  HBsAg Neg  HIV NR  GC neg  Chlamydia neg  Genetic screening negative  1 hour GTT 75  3 hour GTT   GBS Negative   TDAP and FLU declined   Plan:  Steve Serna was discharged to home in good condition. Follow-up appointment at  [facility] in 1 week for a BP check.    Discharge Medications: PNV Will stop Labetalol and recheck BP in 1 week  Follow-up Information    Sigmon, Tyler Deis, CNM .   Specialty:  Certified Nurse Midwife Contact information: [address] [place] [address] [phone]           Signed: Catheryn Bacon, MSN, CNM, FNP

## 2016-08-30 NOTE — Progress Notes (Signed)
S:  Attempted foley bulb placement without success, pt. Had a lot of discomfort, bloody show present   O:  VS: Blood pressure (!) 154/87, pulse 72, temperature 98.8 F (37.1 C), temperature source Oral, resp. rate 18, height 5\' 9"  (1.753 m), weight 78.9 kg (174 lb), last menstrual period 10/25/2015, SpO2 98 %.        FHR : baseline 140 bpm / variability moderate / accelerations + / no decelerations        Toco: contractions every 3-5 minutes / mild         Cervix : Dilation: 2 Effacement (%): 60 Cervical Position: Posterior Station: Ballotable Presentation: Vertex Exam by:: M Merric Yost CNM        Membranes: intact  A: Induction labor     FHR category 1     CHTN with superimposed pre-eclampsia   P: Begin Pitocin at 1 milliunit and increase by 2 milliunits       Continue expectant management     AROM when appropriate     Anticipate NSVD  Jasmine Pruitt, CNM

## 2016-08-30 NOTE — Progress Notes (Addendum)
S: Feeling some "cramps"     Cervidil to come out at 0900, I removed cervidil at 0845 without difficulty     Pt. Desires to bathe at bedside     Denies HA, visual disturbances, epigastric pain   O:  VS: Blood pressure 139/84, pulse 80, temperature 98.8 F (37.1 C), temperature source Oral, resp. rate 20, height 5\' 9"  (1.753 m), weight 78.9 kg (174 lb), last menstrual period 10/25/2015, SpO2 98 %.         Lungs: clear, equal bilaterally        Heart: s1, s2, RRR        Extremities: +1 edema in hands bilaterally, no edema noted in lower extremities, SCDs on, +1 DTRs, no clonus         FHR : baseline 135 bpm / variability moderate / accelerations + / occasional variable decelerations        Toco: contractions every 3-4 minutes / mild         Cervix : 2cm/60%/-3/vtx/ BBOW with ctx        Membranes: intact Results for orders placed or performed during the hospital encounter of 08/28/16 (from the past 24 hour(s))  CBC     Status: None   Collection Time: 08/29/16 11:30 AM  Result Value Ref Range   WBC 8.4 3.6 - 11.0 K/uL   RBC 4.35 3.80 - 5.20 MIL/uL   Hemoglobin 12.4 12.0 - 16.0 g/dL   HCT 09.837.1 11.935.0 - 14.747.0 %   MCV 85.1 80.0 - 100.0 fL   MCH 28.4 26.0 - 34.0 pg   MCHC 33.4 32.0 - 36.0 g/dL   RDW 82.914.1 56.211.5 - 13.014.5 %   Platelets 159 150 - 440 K/uL  Comprehensive metabolic panel     Status: Abnormal   Collection Time: 08/29/16 11:30 AM  Result Value Ref Range   Sodium 132 (L) 135 - 145 mmol/L   Potassium 3.6 3.5 - 5.1 mmol/L   Chloride 105 101 - 111 mmol/L   CO2 24 22 - 32 mmol/L   Glucose, Bld 89 65 - 99 mg/dL   BUN <5 (L) 6 - 20 mg/dL   Creatinine, Ser 8.650.56 0.44 - 1.00 mg/dL   Calcium 7.4 (L) 8.9 - 10.3 mg/dL   Total Protein 6.5 6.5 - 8.1 g/dL   Albumin 2.9 (L) 3.5 - 5.0 g/dL   AST 784108 (H) 15 - 41 U/L   ALT 104 (H) 14 - 54 U/L   Alkaline Phosphatase 157 (H) 38 - 126 U/L   Total Bilirubin 0.5 0.3 - 1.2 mg/dL   GFR calc non Af Amer >60 >60 mL/min   GFR calc Af Amer >60 >60 mL/min    Anion gap 3 (L) 5 - 15  CBC     Status: None   Collection Time: 08/29/16  5:44 PM  Result Value Ref Range   WBC 9.2 3.6 - 11.0 K/uL   RBC 4.15 3.80 - 5.20 MIL/uL   Hemoglobin 12.0 12.0 - 16.0 g/dL   HCT 69.635.3 29.535.0 - 28.447.0 %   MCV 85.2 80.0 - 100.0 fL   MCH 29.0 26.0 - 34.0 pg   MCHC 34.0 32.0 - 36.0 g/dL   RDW 13.214.1 44.011.5 - 10.214.5 %   Platelets 161 150 - 440 K/uL  Comprehensive metabolic panel     Status: Abnormal   Collection Time: 08/29/16  5:44 PM  Result Value Ref Range   Sodium 133 (L) 135 - 145 mmol/L   Potassium 3.6  3.5 - 5.1 mmol/L   Chloride 105 101 - 111 mmol/L   CO2 25 22 - 32 mmol/L   Glucose, Bld 84 65 - 99 mg/dL   BUN <5 (L) 6 - 20 mg/dL   Creatinine, Ser 1.610.58 0.44 - 1.00 mg/dL   Calcium 7.4 (L) 8.9 - 10.3 mg/dL   Total Protein 6.4 (L) 6.5 - 8.1 g/dL   Albumin 2.9 (L) 3.5 - 5.0 g/dL   AST 91 (H) 15 - 41 U/L   ALT 97 (H) 14 - 54 U/L   Alkaline Phosphatase 149 (H) 38 - 126 U/L   Total Bilirubin 0.3 0.3 - 1.2 mg/dL   GFR calc non Af Amer >60 >60 mL/min   GFR calc Af Amer >60 >60 mL/min   Anion gap 3 (L) 5 - 15  Comprehensive metabolic panel     Status: Abnormal   Collection Time: 08/30/16  7:56 AM  Result Value Ref Range   Sodium 135 135 - 145 mmol/L   Potassium 3.8 3.5 - 5.1 mmol/L   Chloride 106 101 - 111 mmol/L   CO2 23 22 - 32 mmol/L   Glucose, Bld 91 65 - 99 mg/dL   BUN <5 (L) 6 - 20 mg/dL   Creatinine, Ser 0.960.49 0.44 - 1.00 mg/dL   Calcium 7.6 (L) 8.9 - 10.3 mg/dL   Total Protein 6.3 (L) 6.5 - 8.1 g/dL   Albumin 2.9 (L) 3.5 - 5.0 g/dL   AST 68 (H) 15 - 41 U/L   ALT 80 (H) 14 - 54 U/L   Alkaline Phosphatase 151 (H) 38 - 126 U/L   Total Bilirubin 0.3 0.3 - 1.2 mg/dL   GFR calc non Af Amer >60 >60 mL/min   GFR calc Af Amer >60 >60 mL/min   Anion gap 6 5 - 15  Magnesium     Status: Abnormal   Collection Time: 08/30/16  7:56 AM  Result Value Ref Range   Magnesium 4.5 (H) 1.7 - 2.4 mg/dL  CBC     Status: Abnormal   Collection Time: 08/30/16  7:56 AM   Result Value Ref Range   WBC 13.1 (H) 3.6 - 11.0 K/uL   RBC 4.29 3.80 - 5.20 MIL/uL   Hemoglobin 12.3 12.0 - 16.0 g/dL   HCT 04.536.6 40.935.0 - 81.147.0 %   MCV 85.3 80.0 - 100.0 fL   MCH 28.8 26.0 - 34.0 pg   MCHC 33.7 32.0 - 36.0 g/dL   RDW 91.413.8 78.211.5 - 95.614.5 %   Platelets 213 150 - 440 K/uL    A: Induction labor      CHTN with superimposed pre-eclampsia      FHR category 2  P: Plan for Foley bulb and Pitocin after bath     Labs this morning improving      Continue Mag sulfate     Anticipate NSVD  Dr. Elesa MassedWard is my back-up physician and aware of patient/plan of care  Carlean JewsMeredith Gleason Ardoin, CNM

## 2016-08-30 NOTE — Discharge Instructions (Signed)
Care After Vaginal Delivery °Congratulations on your new baby!! ° °Refer to this sheet in the next few weeks. These discharge instructions provide you with information on caring for yourself after delivery. Your caregiver may also give you specific instructions. Your treatment has been planned according to the most current medical practices available, but problems sometimes occur. Call your caregiver if you have any problems or questions after you go home. ° °HOME CARE INSTRUCTIONS °· Take over-the-counter or prescription medicines only as directed by your caregiver or pharmacist. °· Do not drink alcohol, especially if you are breastfeeding or taking medicine to relieve pain. °· Do not chew or smoke tobacco. °· Do not use illegal drugs. °· Continue to use good perineal care. Good perineal care includes: °¨ Wiping your perineum from front to back. °¨ Keeping your perineum clean. °· Do not use tampons or douche until your caregiver says it is okay. °· Shower, wash your hair, and take tub baths as directed by your caregiver. °· Wear a well-fitting bra that provides breast support. °· Eat healthy foods. °· Drink enough fluids to keep your urine clear or pale yellow. °· Eat high-fiber foods such as whole grain cereals and breads, brown rice, beans, and fresh fruits and vegetables every day. These foods may help prevent or relieve constipation. °· Follow your caregiver's recommendations regarding resumption of activities such as climbing stairs, driving, lifting, exercising, or traveling. Specifically, no driving for two weeks, so that you are comfortable reacting quickly in an emergency. °· Talk to your caregiver about resuming sexual activities. Resumption of sexual activities is dependent upon your risk of infection, your rate of healing, and your comfort and desire to resume sexual activity. Usually we recommend waiting about six weeks, or until your bleeding stops and you are interested in sex. °· Try to have someone  help you with your household activities and your newborn for at least a few days after you leave the hospital. Even longer is better. °· Rest as much as possible. Try to rest or take a nap when your newborn is sleeping. Sleep deprivation can be very hard after delivery. °· Increase your activities gradually. °· Keep all of your scheduled postpartum appointments. It is very important to keep your scheduled follow-up appointments. At these appointments, your caregiver will be checking to make sure that you are healing physically and emotionally. ° °SEEK MEDICAL CARE IF:  °· You are passing large clots from your vagina.  °· You have a foul smelling discharge from your vagina. °· You have trouble urinating. °· You are urinating frequently. °· You have pain when you urinate. °· You have a change in your bowel movements. °· You have increasing redness, pain, or swelling near your vaginal incision (episiotomy) or vaginal tear. °· You have pus draining from your episiotomy or vaginal tear. °· Your episiotomy or vaginal tear is separating. °· You have painful, hard, or reddened breasts. °· You have a severe headache. °· You have blurred vision or see spots. °· You feel sad or depressed. °· You have thoughts of hurting yourself or your newborn. °· You have questions about your care, the care of your newborn, or medicines. °· You are dizzy or light-headed. °· You have a rash. °· You have nausea or vomiting. °· You were breastfeeding and have not had a menstrual period within 12 weeks after you stopped breastfeeding. °· You are not breastfeeding and have not had a menstrual period by the 12th week after delivery. °· You   have a fever. ° °SEEK IMMEDIATE MEDICAL CARE IF:  °· You have persistent pain. °· You have chest pain. °· You have shortness of breath. °· You faint. °· You have leg pain. °· You have stomach pain. °· Your vaginal bleeding saturates two or more sanitary pads in 1 hour. ° °MAKE SURE YOU:  °· Understand these  instructions. °· Will get help right away if you are not doing well or get worse. °·  °Document Released: 08/29/2000 Document Revised: 01/16/2014 Document Reviewed: 04/28/2012 ° °[employer]® Patient Information ©2015 [employer]. This information is not intended to replace advice given to you by your health care provider. Make sure you discuss any questions you have with your health care provider. ° °

## 2016-08-30 NOTE — Anesthesia Preprocedure Evaluation (Signed)
Anesthesia Evaluation  Patient identified by MRN, date of birth, ID band Patient awake    Reviewed: Allergy & Precautions, H&P , NPO status , Patient's Chart, lab work & pertinent test results  History of Anesthesia Complications Negative for: history of anesthetic complications  Airway Mallampati: III  TM Distance: >3 FB Neck ROM: full    Dental  (+) Poor Dentition   Pulmonary neg shortness of breath,    Pulmonary exam normal breath sounds clear to auscultation       Cardiovascular Exercise Tolerance: Good hypertension, Normal cardiovascular exam Rhythm:regular Rate:Normal     Neuro/Psych    GI/Hepatic negative GI ROS,   Endo/Other    Renal/GU   negative genitourinary   Musculoskeletal   Abdominal   Peds  Hematology negative hematology ROS (+)   Anesthesia Other Findings Past Medical History: No date: Anemia No date: Heart murmur     Comment: as a child - resolved No date: Hypertension  Past Surgical History: No date: WISDOM TOOTH EXTRACTION  BMI    Body Mass Index:  25.70 kg/m      Reproductive/Obstetrics (+) Pregnancy                             Anesthesia Physical Anesthesia Plan  ASA: III  Anesthesia Plan: Epidural   Post-op Pain Management:    Induction:   Airway Management Planned:   Additional Equipment:   Intra-op Plan:   Post-operative Plan:   Informed Consent: I have reviewed the patients History and Physical, chart, labs and discussed the procedure including the risks, benefits and alternatives for the proposed anesthesia with the patient or authorized representative who has indicated his/her understanding and acceptance.     Plan Discussed with: Anesthesiologist  Anesthesia Plan Comments:         Anesthesia Quick Evaluation

## 2016-08-30 NOTE — Anesthesia Procedure Notes (Signed)
Epidural Patient location during procedure: OB Start time: 08/30/2016 12:00 PM End time: 08/30/2016 12:08 PM  Staffing Anesthesiologist: Margorie JohnPISCITELLO, JOSEPH K Performed: anesthesiologist   Preanesthetic Checklist Completed: patient identified, site marked, surgical consent, pre-op evaluation, timeout performed, IV checked, risks and benefits discussed and monitors and equipment checked  Epidural Patient position: sitting Prep: Betadine Patient monitoring: heart rate, continuous pulse ox and blood pressure Approach: midline Location: L4-L5 Injection technique: LOR saline  Needle:  Needle type: Tuohy  Needle gauge: 17 G Needle length: 9 cm and 9 Needle insertion depth: 6 cm Catheter type: closed end flexible Catheter size: 19 Gauge Catheter at skin depth: 12 cm Test dose: negative and 1.5% lidocaine with Epi 1:200 K  Assessment Sensory level: T10 Events: blood not aspirated, injection not painful, no injection resistance, negative IV test and no paresthesia  Additional Notes Pt. Evaluated and documentation done after procedure finished. Patient identified. Risks/Benefits/Options discussed with patient including but not limited to bleeding, infection, nerve damage, paralysis, failed block, incomplete pain control, headache, blood pressure changes, nausea, vomiting, reactions to medication both or allergic, itching and postpartum back pain. Confirmed with bedside nurse the patient's most recent platelet count. Confirmed with patient that they are not currently taking any anticoagulation, have any bleeding history or any family history of bleeding disorders. Patient expressed understanding and wished to proceed. All questions were answered. Sterile technique was used throughout the entire procedure. Please see nursing notes for vital signs. Test dose was given through epidural catheter and negative prior to continuing to dose epidural or start infusion. Warning signs of high block given  to the patient including shortness of breath, tingling/numbness in hands, complete motor block, or any concerning symptoms with instructions to call for help. Patient was given instructions on fall risk and not to get out of bed. All questions and concerns addressed with instructions to call with any issues or inadequate analgesia.   Patient tolerated the insertion well without immediate complications.Reason for block:procedure for pain

## 2016-08-30 NOTE — Progress Notes (Signed)
Removed epidural catheter. Active bleeding upon removal. Catheter tip intact. Applied pressure. Continued to bleed. Made a pressure dressing, which continued to bleed through. Made a second pressure dressing, appeared to be getting better. Notified Dr. Orson ApePiscatello [facility] to assess.

## 2016-08-31 LAB — COMPREHENSIVE METABOLIC PANEL
ALK PHOS: 111 U/L (ref 38–126)
ALT: 49 U/L (ref 14–54)
AST: 35 U/L (ref 15–41)
Albumin: 2.3 g/dL — ABNORMAL LOW (ref 3.5–5.0)
Anion gap: 3 — ABNORMAL LOW (ref 5–15)
CALCIUM: 7.5 mg/dL — AB (ref 8.9–10.3)
CHLORIDE: 107 mmol/L (ref 101–111)
CO2: 26 mmol/L (ref 22–32)
CREATININE: 0.51 mg/dL (ref 0.44–1.00)
Glucose, Bld: 89 mg/dL (ref 65–99)
Potassium: 3.6 mmol/L (ref 3.5–5.1)
SODIUM: 136 mmol/L (ref 135–145)
Total Bilirubin: 0.5 mg/dL (ref 0.3–1.2)
Total Protein: 5.1 g/dL — ABNORMAL LOW (ref 6.5–8.1)

## 2016-08-31 LAB — CBC
HCT: 31.2 % — ABNORMAL LOW (ref 35.0–47.0)
HEMOGLOBIN: 10.5 g/dL — AB (ref 12.0–16.0)
MCH: 28.7 pg (ref 26.0–34.0)
MCHC: 33.5 g/dL (ref 32.0–36.0)
MCV: 85.6 fL (ref 80.0–100.0)
Platelets: 148 10*3/uL — ABNORMAL LOW (ref 150–440)
RBC: 3.65 MIL/uL — AB (ref 3.80–5.20)
RDW: 14.1 % (ref 11.5–14.5)
WBC: 10.6 10*3/uL (ref 3.6–11.0)

## 2016-08-31 LAB — MAGNESIUM: Magnesium: 4.3 mg/dL — ABNORMAL HIGH (ref 1.7–2.4)

## 2016-08-31 NOTE — Progress Notes (Addendum)
PPD #1, SVD, baby Girl    S:  Reports feeling good             Tolerating po/ No nausea or vomiting             Bleeding is light             Pain controlled none and  not requiring pain medication             Up ad lib / ambulatory with assistance / Foley catheter in place due to the Magnesium   Newborn formula feeding   Denies HA, visual disturbances, and epigastric pain  O:               VS: BP 131/72   Pulse 73   Temp 97.9 F (36.6 C) (Oral)   Resp 20   Ht 5\' 9"  (1.753 m)   Wt 78.9 kg (174 lb)   LMP 10/25/2015 (LMP Unknown)   SpO2 99%   Breastfeeding? Unknown   BMI 25.70 kg/m    LABS:              Recent Labs  08/30/16 0756 08/31/16 0615  WBC 13.1* 10.6  HGB 12.3 10.5*  PLT 213 148*               Blood type: --/--/O POS (12/14 1725)  Rubella: Immune (08/04 0000)                     I&O: Intake/Output      12/16 0701 - 12/17 0700 12/17 0701 - 12/18 0700   P.O. 660 354   I.V. (mL/kg) 2433.6 (30.8)    Total Intake(mL/kg) 3093.6 (39.2) 354 (4.5)   Urine (mL/kg/hr) 6325 (3.3) 1700 (6.3)   Stool     Blood 350 (0.2)    Total Output 6675 1700   Net -3581.4 -1346         Results for orders placed or performed during the hospital encounter of 08/28/16 (from the past 24 hour(s))  CBC     Status: Abnormal   Collection Time: 08/31/16  6:15 AM  Result Value Ref Range   WBC 10.6 3.6 - 11.0 K/uL   RBC 3.65 (L) 3.80 - 5.20 MIL/uL   Hemoglobin 10.5 (L) 12.0 - 16.0 g/dL   HCT 40.931.2 (L) 81.135.0 - 91.447.0 %   MCV 85.6 80.0 - 100.0 fL   MCH 28.7 26.0 - 34.0 pg   MCHC 33.5 32.0 - 36.0 g/dL   RDW 78.214.1 95.611.5 - 21.314.5 %   Platelets 148 (L) 150 - 440 K/uL  Comprehensive metabolic panel     Status: Abnormal   Collection Time: 08/31/16  6:15 AM  Result Value Ref Range   Sodium 136 135 - 145 mmol/L   Potassium 3.6 3.5 - 5.1 mmol/L   Chloride 107 101 - 111 mmol/L   CO2 26 22 - 32 mmol/L   Glucose, Bld 89 65 - 99 mg/dL   BUN <5 (L) 6 - 20 mg/dL   Creatinine, Ser 0.860.51 0.44 - 1.00  mg/dL   Calcium 7.5 (L) 8.9 - 10.3 mg/dL   Total Protein 5.1 (L) 6.5 - 8.1 g/dL   Albumin 2.3 (L) 3.5 - 5.0 g/dL   AST 35 15 - 41 U/L   ALT 49 14 - 54 U/L   Alkaline Phosphatase 111 38 - 126 U/L   Total Bilirubin 0.5 0.3 - 1.2 mg/dL   GFR calc non Af Amer >60 >  60 mL/min   GFR calc Af Amer >60 >60 mL/min   Anion gap 3 (L) 5 - 15  Magnesium     Status: Abnormal   Collection Time: 08/31/16  6:15 AM  Result Value Ref Range   Magnesium 4.3 (H) 1.7 - 2.4 mg/dL               Physical Exam:             Alert and oriented X3  Lungs: Clear and unlabored  Heart: regular rate and rhythm / no mumurs  Abdomen: soft, non-tender, non-distended              Fundus: firm, non-tender, U-2  Perineum: intact, no significant erythema, no significant edema  Lochia: appropriate, no clots  Extremities: no edema, no calf pain or tenderness, +1DTRs, and no clonus,SCDs on      A: PPD # 1   Doing well - stable status  CHTN with superimposed pre-eclampsia   Mild ABL Anemia   P: Routine post partum orders  D/C Mag and foley at 1337 today  Ferrous Sulfate BID  Wants Depo prior to discharge  Liver enzymes have normalized   Anticipate D/C home tomorrow - will need a BP in 1 week in office   Carlean JewsMeredith Burnell Hurta, CNM

## 2016-09-01 ENCOUNTER — Ambulatory Visit: Payer: BC Managed Care – PPO

## 2016-09-01 ENCOUNTER — Other Ambulatory Visit: Payer: BC Managed Care – PPO

## 2016-09-01 MED ORDER — MEDROXYPROGESTERONE ACETATE 150 MG/ML IM SUSP: 150.0000 mg | Freq: Once | INTRAMUSCULAR | 0 refills | Status: DC

## 2016-09-01 MED ORDER — MEDROXYPROGESTERONE ACETATE 150 MG/ML IM SUSP
150.0000 mg | Freq: Once | INTRAMUSCULAR | Status: AC
  Administered 2016-09-01: 150 mg via INTRAMUSCULAR
  Filled 2016-09-01: qty 1

## 2016-09-01 NOTE — Progress Notes (Signed)
Patient discharge to home via wheelchair with spouse and baby in car seat.  

## 2016-09-01 NOTE — Progress Notes (Signed)
Post Partum Day 2 Subjective: no complaints, up ad lib, voiding and tolerating PO  Objective: Blood pressure (!) 144/70, pulse 63, temperature 98.2 F (36.8 C), temperature source Oral, resp. rate 18, height 5\' 9"  (1.753 m), weight 174 lb (78.9 kg), last menstrual period 10/25/2015, SpO2 98 %, unknown if currently breastfeeding.  Physical Exam:  General: alert, cooperative and appears stated age  Heart: S1S2, RRR, No M/R/G. Lungs: CTA bilat, no W/R/R. Fundus: firm, U-2 Lochia: appropriate, no clots DVT Evaluation: Neg Homans   Recent Labs  08/30/16 0756 08/31/16 0615  HGB 12.3 10.5*  HCT 36.6 31.2*  WBC 13.1* 10.6  PLT 213 148*    Assessment/Plan: A: PPD#2 P: DC home 2. Depo today 3. FU in 1-2 weeks for BP check.    LOS: 4 days   Sharee PimpleCaron W Jones 09/01/2016, 8:58 AM

## 2016-09-01 NOTE — Anesthesia Postprocedure Evaluation (Signed)
Anesthesia Post Note  Patient: Jasmine Pruitt  Procedure(s) Performed: * No procedures listed *  Patient location during evaluation: Mother Baby Anesthesia Type: Epidural Level of consciousness: awake and alert Pain management: pain level controlled Vital Signs Assessment: post-procedure vital signs reviewed and stable Respiratory status: spontaneous breathing, nonlabored ventilation and respiratory function stable Cardiovascular status: stable Postop Assessment: no headache, no backache and epidural receding Anesthetic complications: no    Last Vitals:  Vitals:   09/01/16 0333 09/01/16 0722  BP: (!) 145/68 (!) 144/70  Pulse: 64 63  Resp: 20 18  Temp: 36.6 C 36.8 C    Last Pain:  Vitals:   09/01/16 0722  TempSrc: Oral  PainSc:                  [name]

## 2016-09-02 LAB — SURGICAL PATHOLOGY

## 2016-09-04 ENCOUNTER — Other Ambulatory Visit: Payer: BC Managed Care – PPO

## 2016-09-06 ENCOUNTER — Encounter: Payer: Self-pay | Admitting: Emergency Medicine

## 2016-09-06 ENCOUNTER — Emergency Department
Admission: EM | Admit: 2016-09-06 | Discharge: 2016-09-06 | Disposition: A | Discharge status: Home / Self Care | Payer: BC Managed Care – PPO | Attending: Emergency Medicine | Admitting: Emergency Medicine

## 2016-09-06 DIAGNOSIS — I10 Essential (primary) hypertension: Secondary | ICD-10-CM | POA: Diagnosis not present

## 2016-09-06 DIAGNOSIS — R51 Headache: Secondary | ICD-10-CM | POA: Diagnosis present

## 2016-09-06 LAB — URINALYSIS, ROUTINE W REFLEX MICROSCOPIC
Bilirubin Urine: NEGATIVE
GLUCOSE, UA: NEGATIVE mg/dL
Ketones, ur: NEGATIVE mg/dL
NITRITE: NEGATIVE
Protein, ur: NEGATIVE mg/dL
SPECIFIC GRAVITY, URINE: 1.01 (ref 1.005–1.030)
pH: 6 (ref 5.0–8.0)

## 2016-09-06 LAB — CBC WITH DIFFERENTIAL/PLATELET
BASOS PCT: 1 %
Basophils Absolute: 0.1 10*3/uL (ref 0–0.1)
EOS ABS: 0 10*3/uL (ref 0–0.7)
Eosinophils Relative: 1 %
HCT: 36.4 % (ref 35.0–47.0)
HEMOGLOBIN: 12 g/dL (ref 12.0–16.0)
Lymphocytes Relative: 30 %
Lymphs Abs: 2.6 10*3/uL (ref 1.0–3.6)
MCH: 28.5 pg (ref 26.0–34.0)
MCHC: 33 g/dL (ref 32.0–36.0)
MCV: 86.2 fL (ref 80.0–100.0)
MONO ABS: 0.5 10*3/uL (ref 0.2–0.9)
MONOS PCT: 6 %
NEUTROS PCT: 62 %
Neutro Abs: 5.4 10*3/uL (ref 1.4–6.5)
Platelets: 407 10*3/uL (ref 150–440)
RBC: 4.22 MIL/uL (ref 3.80–5.20)
RDW: 14 % (ref 11.5–14.5)
WBC: 8.7 10*3/uL (ref 3.6–11.0)

## 2016-09-06 LAB — BASIC METABOLIC PANEL
Anion gap: 8 (ref 5–15)
BUN: 12 mg/dL (ref 6–20)
CALCIUM: 9.1 mg/dL (ref 8.9–10.3)
CO2: 26 mmol/L (ref 22–32)
CREATININE: 0.91 mg/dL (ref 0.44–1.00)
Chloride: 105 mmol/L (ref 101–111)
GFR calc non Af Amer: >60 mL/min (ref >60)
Glucose, Bld: 95 mg/dL (ref 65–99)
Potassium: 3.6 mmol/L (ref 3.5–5.1)
Sodium: 139 mmol/L (ref 135–145)

## 2016-09-06 LAB — HEPATIC FUNCTION PANEL
ALK PHOS: 100 U/L (ref 38–126)
ALT: 36 U/L (ref 14–54)
AST: 35 U/L (ref 15–41)
Albumin: 3.4 g/dL — ABNORMAL LOW (ref 3.5–5.0)
BILIRUBIN DIRECT: 0.1 mg/dL (ref 0.1–0.5)
BILIRUBIN INDIRECT: 0.4 mg/dL (ref 0.3–0.9)
Total Bilirubin: 0.5 mg/dL (ref 0.3–1.2)
Total Protein: 7 g/dL (ref 6.5–8.1)

## 2016-09-06 LAB — TROPONIN I: Troponin I: <0.03 ng/mL (ref <0.03)

## 2016-09-06 MED ORDER — NIFEDIPINE ER OSMOTIC RELEASE 30 MG PO TB24: 30.0000 mg | ORAL_TABLET | Freq: Every day | ORAL | 2 refills | Status: DC

## 2016-09-06 MED ORDER — LABETALOL HCL 5 MG/ML IV SOLN
20.0000 mg | Freq: Once | INTRAVENOUS | Status: AC
  Administered 2016-09-06: 20 mg via INTRAVENOUS
  Filled 2016-09-06: qty 4

## 2016-09-06 MED ORDER — NIFEDIPINE ER 30 MG PO TB24
30.0000 mg | ORAL_TABLET | Freq: Once | ORAL | Status: AC
  Administered 2016-09-06: 30 mg via ORAL
  Filled 2016-09-06: qty 1

## 2016-09-06 NOTE — ED Triage Notes (Signed)
Pt arrives at ER with c/o of HTN. At home, BP was 186/97. Pt was induced seven days ago due to HTN with healthy baby girl at home. Pt is alert and oriented in triage with NAD noted at this time.

## 2016-09-06 NOTE — ED Provider Notes (Signed)
[facility] Emergency Department Provider Note  Time seen: 8:35 PM  I have reviewed the triage vital signs and the nursing notes.   HISTORY  Chief Complaint Hypertension    HPI Jasmine Pruitt is a 40 y.o. female who presents to the emergency department with high blood pressure. According to the patient she delivered approximately one week ago after being induced for high blood pressure. Patient states today she is experiencing a headache so she took her blood pressure at home and it was greater than 190 systolic so she came to the emergency department for evaluation. She states the headache is gone, denies any complaints at this time besides continued elevated blood pressure. Denies any abdominal pain, states mild vaginal bleeding. Denies any fever.  Past Medical History:  Diagnosis Date  . Anemia   . Heart murmur    as a child - resolved  . Hypertension     Patient Active Problem List   Diagnosis Date Noted  . Hypertension affecting pregnancy in third trimester 08/28/2016  . Chronic hypertension with superimposed pre-eclampsia 08/28/2016  . Chronic hypertension affecting pregnancy 08/27/2016  . Labor and delivery indication for care or intervention 06/23/2016  . Advanced maternal age in multigravida 05/01/2016    Past Surgical History:  Procedure Laterality Date  . WISDOM TOOTH EXTRACTION      Prior to Admission medications   Medication Sig Start Date End Date Taking? Authorizing Provider  medroxyPROGESTERone (DEPO-PROVERA) 150 MG/ML injection Inject 1 mL (150 mg total) into the muscle once. 09/01/16 09/01/16  Sharee Pimplearon W Jones, CNM  prenatal vitamin w/FE, FA (PRENATAL 1 + 1) 27-1 MG TABS tablet Take 1 tablet by mouth daily at 12 noon.    Historical Provider, MD    Allergies  Allergen Reactions  . Sulfa Antibiotics Rash    Family History  Problem Relation Age of Onset  . Diabetes Mother   . Hypertension Father     Social History Social  History  Substance Use Topics  . Smoking status: Never Smoker  . Smokeless tobacco: Never Used  . Alcohol use Yes     Comment: occasionally    Review of Systems Constitutional: Negative for fever. Cardiovascular: Negative for chest pain. Respiratory: Negative for shortness of breath. Gastrointestinal: Negative for abdominal pain Neurological: Positive for headache, now resolved. 10-point ROS otherwise negative.  ____________________________________________   PHYSICAL EXAM:  VITAL SIGNS: ED Triage Vitals  Enc Vitals Group     BP 09/06/16 1916 (!) 184/96     Pulse Rate 09/06/16 1916 68     Resp 09/06/16 1916 18     Temp 09/06/16 1916 98.4 F (36.9 C)     Temp Source 09/06/16 1916 Oral     SpO2 09/06/16 1916 100 %     Weight 09/06/16 1918 164 lb (74.4 kg)     Height 09/06/16 1918 5\' 9"  (1.753 m)     Head Circumference --      Peak Flow --      Pain Score 09/06/16 1918 0     Pain Loc --      Pain Edu? --      Excl. in GC? --     Constitutional: Alert and oriented. Well appearing and in no distress. Eyes: Normal exam ENT   Head: Normocephalic and atraumatic.   Mouth/Throat: Mucous membranes are moist. Cardiovascular: Normal rate, regular rhythm. No murmur Respiratory: Normal respiratory effort without tachypnea nor retractions. Breath sounds are clear Gastrointestinal: Soft and nontender. No distention.  Musculoskeletal: Nontender with normal range of motion in all extremities.  Neurologic:  Normal speech and language. No gross focal neurologic deficits  Skin:  Skin is warm, dry and intact.  Psychiatric: Mood and affect are normal.  ____________________________________________    EKG  EKG reviewed and interpreted by myself shows normal sinus rhythm at 67 bpm, normal axis, Larson normal intervals besides a prolonged PR interval consistent with a first-degree AV block, nonspecific ST changes but no ST  elevations.  ____________________________________________   INITIAL IMPRESSION / ASSESSMENT AND PLAN / ED COURSE  Pertinent labs & imaging results that were available during my care of the patient were reviewed by me and considered in my medical decision making (see chart for details).  Patient presents to the emergency department for headache and elevated blood pressure. Patient's labs so far are largely within normal limits including no protein in her urine, normal platelets, LFTs are pending. I discussed the patient with Dr. Elesa MassedWard of OB/GYN who recommends dosing 20 mg of IV labetalol as well as Procardia. If the patient can maintain a systolic blood pressure less than 160 and it will be safe to discharge her home with continued Procardia and they will see her in the office on Tuesday. Patient remained headache free, we will dose medications and closely monitor.  Patient is maintaining a blood pressure below 160 systolic, currently 148/77. Patient will be discharged home with Procardia, 30 mg daily. Patient will follow-up with Dr. Elesa MassedWard on Tuesday. ____________________________________________   FINAL CLINICAL IMPRESSION(S) / ED DIAGNOSES  Hypertension    Minna AntisKevin Doniel Maiello, MD 09/06/16 2310

## 2016-09-06 NOTE — ED Notes (Signed)
Meal tray provided.

## 2016-09-06 NOTE — Discharge Instructions (Signed)
Please call the number provided to arrange a follow-up appointment for Tuesday or Wednesday of this week to have your blood pressure rechecked. Return to the emergency department for any headache, elevated blood pressure at home, or any other symptom personally concerning to yourself.

## 2016-09-06 NOTE — ED Notes (Signed)
Pt with hr in low 60s before administration of labetalol. Dr. Lenard Lancepaduchowski notified and orders labetalol with this knowledgge.

## 2016-09-06 NOTE — ED Notes (Signed)
Pt states she is one week post partum. Pt states she began to develop a headache around 1600 today that was posterior right skull that was constant and "not real bad" per pt. Pt states she took 500mg  of tylenol and headache is gone at this time. Pt states she went to [facility] and took her blood pressure and it was 187/97. Pt states she was monitored for htn during pregnancy. Pt states she felt like her vision was blurry, but denies other symptoms. Skin normal color warm and dry.

## 2016-09-07 ENCOUNTER — Emergency Department
Admission: EM | Admit: 2016-09-07 | Discharge: 2016-09-07 | Disposition: A | Discharge status: Home / Self Care | Payer: BC Managed Care – PPO | Attending: Emergency Medicine | Admitting: Emergency Medicine

## 2016-09-07 ENCOUNTER — Encounter: Payer: Self-pay | Admitting: Emergency Medicine

## 2016-09-07 DIAGNOSIS — Z79899 Other long term (current) drug therapy: Secondary | ICD-10-CM | POA: Diagnosis not present

## 2016-09-07 DIAGNOSIS — O1003 Pre-existing essential hypertension complicating the puerperium: Secondary | ICD-10-CM | POA: Insufficient documentation

## 2016-09-07 DIAGNOSIS — I10 Essential (primary) hypertension: Secondary | ICD-10-CM

## 2016-09-07 DIAGNOSIS — R51 Headache: Secondary | ICD-10-CM | POA: Diagnosis present

## 2016-09-07 LAB — URINALYSIS, COMPLETE (UACMP) WITH MICROSCOPIC
BACTERIA UA: NONE SEEN
BILIRUBIN URINE: NEGATIVE
Glucose, UA: NEGATIVE mg/dL
KETONES UR: NEGATIVE mg/dL
NITRITE: NEGATIVE
PROTEIN: NEGATIVE mg/dL
Specific Gravity, Urine: 1.009 (ref 1.005–1.030)
pH: 6 (ref 5.0–8.0)

## 2016-09-07 LAB — COMPREHENSIVE METABOLIC PANEL
ALBUMIN: 3.8 g/dL (ref 3.5–5.0)
ALK PHOS: 114 U/L (ref 38–126)
ALT: 33 U/L (ref 14–54)
ANION GAP: 9 (ref 5–15)
AST: 30 U/L (ref 15–41)
BUN: 8 mg/dL (ref 6–20)
CALCIUM: 9.8 mg/dL (ref 8.9–10.3)
CHLORIDE: 104 mmol/L (ref 101–111)
CO2: 26 mmol/L (ref 22–32)
Creatinine, Ser: 0.75 mg/dL (ref 0.44–1.00)
GFR calc non Af Amer: >60 mL/min (ref >60)
GLUCOSE: 85 mg/dL (ref 65–99)
Potassium: 3.5 mmol/L (ref 3.5–5.1)
SODIUM: 139 mmol/L (ref 135–145)
Total Bilirubin: 1.2 mg/dL (ref 0.3–1.2)
Total Protein: 7.9 g/dL (ref 6.5–8.1)

## 2016-09-07 LAB — CBC WITH DIFFERENTIAL/PLATELET
BASOS PCT: 0 %
Basophils Absolute: 0 10*3/uL (ref 0–0.1)
EOS ABS: 0 10*3/uL (ref 0–0.7)
EOS PCT: 0 %
HCT: 43.8 % (ref 35.0–47.0)
HEMOGLOBIN: 14.3 g/dL (ref 12.0–16.0)
Lymphocytes Relative: 24 %
Lymphs Abs: 2.1 10*3/uL (ref 1.0–3.6)
MCH: 28.1 pg (ref 26.0–34.0)
MCHC: 32.6 g/dL (ref 32.0–36.0)
MCV: 86.4 fL (ref 80.0–100.0)
Monocytes Absolute: 0.3 10*3/uL (ref 0.2–0.9)
Monocytes Relative: 4 %
NEUTROS PCT: 72 %
Neutro Abs: 6.1 10*3/uL (ref 1.4–6.5)
PLATELETS: 364 10*3/uL (ref 150–440)
RBC: 5.07 MIL/uL (ref 3.80–5.20)
RDW: 14.3 % (ref 11.5–14.5)
WBC: 8.5 10*3/uL (ref 3.6–11.0)

## 2016-09-07 MED ORDER — LABETALOL HCL 100 MG PO TABS
100.0000 mg | ORAL_TABLET | Freq: Once | ORAL | Status: AC
  Administered 2016-09-07: 100 mg via ORAL
  Filled 2016-09-07: qty 1

## 2016-09-07 MED ORDER — LABETALOL HCL 100 MG PO TABS: 100.0000 mg | ORAL_TABLET | Freq: Two times a day (BID) | ORAL | 0 refills | Status: DC

## 2016-09-07 MED ORDER — NIFEDIPINE ER 60 MG PO TB24
60.0000 mg | ORAL_TABLET | Freq: Once | ORAL | Status: AC
  Administered 2016-09-07: 60 mg via ORAL
  Filled 2016-09-07: qty 1

## 2016-09-07 NOTE — ED Notes (Signed)
Report given to Rebecca, RN

## 2016-09-07 NOTE — ED Triage Notes (Signed)
Pt has been having high blood since pregnancy that has continued after pregnancy. Was seen yesterday and prescribed procardia xl - had one dose last night

## 2016-09-07 NOTE — ED Provider Notes (Addendum)
[facility] Emergency Department Provider Note  ____________________________________________   First MD Initiated Contact with Patient 09/07/16 1750     (approximate)  I have reviewed the triage vital signs and the nursing notes.   HISTORY  Chief Complaint Hypertension   HPI Jasmine Pruitt is a 40 y.o. female who is one week post partum after being induced for hypertension. She was seen in the emergency department yesterday and prescribed Procardia. However, today in the a.m. she had return of her headache which brought her to the emergency department yesterday. She says the headache is bitemporal, throbbing and a 5-6 out of 10 at this time. She does not report any blurred vision. Denies any nausea or vomiting. No abdominal pain. She said that she also took her blood pressure at a pharmacy today and found herself to be hypertensive and return to the emergency department. She has not taken her second dose of Procardia because she was told to take to take it around 10 PM this evening.   Past Medical History:  Diagnosis Date  . Anemia   . Heart murmur    as a child - resolved  . Hypertension     Patient Active Problem List   Diagnosis Date Noted  . Hypertension affecting pregnancy in third trimester 08/28/2016  . Chronic hypertension with superimposed pre-eclampsia 08/28/2016  . Chronic hypertension affecting pregnancy 08/27/2016  . Labor and delivery indication for care or intervention 06/23/2016  . Advanced maternal age in multigravida 05/01/2016    Past Surgical History:  Procedure Laterality Date  . WISDOM TOOTH EXTRACTION      Prior to Admission medications   Medication Sig Start Date End Date Taking? Authorizing Provider  medroxyPROGESTERone (DEPO-PROVERA) 150 MG/ML injection Inject 1 mL (150 mg total) into the muscle once. 09/01/16 09/01/16  Sharee Pimplearon W Jones, CNM  NIFEdipine (PROCARDIA-XL/ADALAT-CC/NIFEDICAL-XL) 30 MG 24 hr tablet Take 1 tablet  (30 mg total) by mouth daily. 09/06/16 09/06/17  Minna AntisKevin Paduchowski, MD  prenatal vitamin w/FE, FA (PRENATAL 1 + 1) 27-1 MG TABS tablet Take 1 tablet by mouth daily at 12 noon.    Historical Provider, MD    Allergies Sulfa antibiotics  Family History  Problem Relation Age of Onset  . Diabetes Mother   . Hypertension Father     Social History Social History  Substance Use Topics  . Smoking status: Never Smoker  . Smokeless tobacco: Never Used  . Alcohol use Yes     Comment: occasionally    Review of Systems Constitutional: No fever/chills Eyes: No visual changes. ENT: No sore throat. Cardiovascular: Denies chest pain. Respiratory: Denies shortness of breath. Gastrointestinal: No abdominal pain.  No nausea, no vomiting.  No diarrhea.  No constipation. Genitourinary: Negative for dysuria. Musculoskeletal: Negative for back pain. Skin: Negative for rash. Neurological: Negative for focal weakness or numbness.  10-point ROS otherwise negative.  ____________________________________________   PHYSICAL EXAM:  VITAL SIGNS: ED Triage Vitals [09/07/16 1701]  Enc Vitals Group     BP (!) 177/103     Pulse Rate 64     Resp 18     Temp 99.2 F (37.3 C)     Temp src      SpO2 100 %     Weight 164 lb (74.4 kg)     Height 5\' 9"  (1.753 m)     Head Circumference      Peak Flow      Pain Score 6     Pain Loc  Pain Edu?      Excl. in GC?     Constitutional: Alert and oriented. Well appearing and in no acute distress. Eyes: Conjunctivae are normal. PERRL. EOMI. Head: Atraumatic. Nose: No congestion/rhinnorhea. Mouth/Throat: Mucous membranes are moist.   Neck: No stridor.   Cardiovascular: Normal rate, regular rhythm. Grossly normal heart sounds.  Good peripheral circulation. Respiratory: Normal respiratory effort.  No retractions. Lungs CTAB. Gastrointestinal: Soft and nontender. No distention. No abdominal bruits. No CVA tenderness. Musculoskeletal: No lower  extremity tenderness nor edema.  No joint effusions. Neurologic:  Normal speech and language. No gross focal neurologic deficits are appreciated.  Skin:  Skin is warm, dry and intact. No rash noted. Psychiatric: Mood and affect are normal. Speech and behavior are normal.  ____________________________________________   LABS (all labs ordered are listed, but only abnormal results are displayed)  Labs Reviewed  URINALYSIS, COMPLETE (UACMP) WITH MICROSCOPIC - Abnormal; Notable for the following:       Result Value   Color, Urine YELLOW (*)    APPearance HAZY (*)    Hgb urine dipstick LARGE (*)    Leukocytes, UA LARGE (*)    Squamous Epithelial / LPF 0-5 (*)    All other components within normal limits  CBC WITH DIFFERENTIAL/PLATELET  COMPREHENSIVE METABOLIC PANEL   ____________________________________________  EKG   ____________________________________________  RADIOLOGY   ____________________________________________   PROCEDURES  Procedure(s) performed:   Procedures  Critical Care performed:   ____________________________________________   INITIAL IMPRESSION / ASSESSMENT AND PLAN / ED COURSE  Pertinent labs & imaging results that were available during my care of the patient were reviewed by me and considered in my medical decision making (see chart for details).  Clinical Course   ----------------------------------------- 640 PM on 09/07/2016 ----------------------------------------- I discussed the case with Dr. Verna Czech who recommends increasing the dose of Procardia to 60 mg daily and also had a lump below 100 mg twice a day. He recommends keeping the blood pressure under 160/110. He also thinks that the patient would be appropriate for discharge home as long as her labs are continually reassuring and her headache resolves with treatment.  ----------------------------------------- 8:38 PM on 09/07/2016 -----------------------------------------  Patient  at this time has a blood pressure 144/97 with a heart rate of 67. This is after 60 of Procardia 100 mg of labetalol by mouth. She says that her headache is a 1 out of 10 right now. She still has no protein in her urine. Her platelets are normal and her liver labs are also normal. The patient will be discharged home with labetalol twice a day of 100 mg. She will also be taking 2 of her Procardia nightly from now on. She knows to follow up this Tuesday at the [facility] clinic for follow-up. She'll be checking her blood pressure tomorrow at a pharmacy and returning to the emergency department for any worsening or concerning symptoms. She is understanding of this plan and willing to comply.  ____________________________________________   FINAL CLINICAL IMPRESSION(S) / ED DIAGNOSES  Postpartum hypertension.    NEW MEDICATIONS STARTED DURING THIS VISIT:  New Prescriptions   No medications on file     Note:  This document was prepared using Dragon voice recognition software and may include unintentional dictation errors.    Myrna Blazer, MD 09/07/16 2041  Patient with large hemoglobin as well as leukocytes in the urine and 6-30 WBCs but without any dysuria. No lower abdominal pain. We'll send for culture.    Myrna Blazer,  MD 09/07/16 2046

## 2016-09-08 ENCOUNTER — Telehealth: Payer: Self-pay | Admitting: Emergency Medicine

## 2016-09-08 NOTE — Telephone Encounter (Signed)
Pt calling and can not get RX filed  , new RX nipedipine xl, 60mg   Daily , Qty 14 , per Dr.Robinson

## 2016-09-09 LAB — URINE CULTURE

## 2016-09-11 ENCOUNTER — Other Ambulatory Visit: Payer: BC Managed Care – PPO

## 2016-09-18 ENCOUNTER — Other Ambulatory Visit: Payer: BC Managed Care – PPO

## 2016-09-22 ENCOUNTER — Other Ambulatory Visit: Payer: BC Managed Care – PPO

## 2016-09-25 ENCOUNTER — Other Ambulatory Visit: Payer: BC Managed Care – PPO

## 2017-01-26 ENCOUNTER — Encounter: Payer: Self-pay | Admitting: Nurse Practitioner

## 2017-01-26 ENCOUNTER — Ambulatory Visit (INDEPENDENT_AMBULATORY_CARE_PROVIDER_SITE_OTHER): Payer: BC Managed Care – PPO | Admitting: Nurse Practitioner

## 2017-01-26 VITALS — BP 160/90 | HR 71 | Ht 69.0 in | Wt 157.8 lb

## 2017-01-26 DIAGNOSIS — I10 Essential (primary) hypertension: Secondary | ICD-10-CM | POA: Diagnosis not present

## 2017-01-26 DIAGNOSIS — Z7689 Persons encountering health services in other specified circumstances: Secondary | ICD-10-CM

## 2017-01-26 MED ORDER — HYDROCHLOROTHIAZIDE 12.5 MG PO CAPS: 12.5000 mg | ORAL_CAPSULE | Freq: Every day | ORAL | 0 refills | Status: DC

## 2017-01-26 NOTE — Patient Instructions (Signed)
ZOXWRU, Thank you for coming in to clinic today.  1. For your hypertension (high blood pressure) - START hydrochlorothiazide 12.5 mg once daily. - Check your BP about 1 time per week.  Bring a log with you to your next appointment. - Continue your work toward healthier lifestyle for increasing your physical activity and improving your diet.  Please schedule a follow-up appointment with Wilhelmina Mcardle, AGNP to Return in about 4 weeks (around 02/23/2017) for hypertension.  If you have any other questions or concerns, please feel free to call the clinic or send a message through MyChart. You may also schedule an earlier appointment if necessary.  Wilhelmina Mcardle, DNP, AGNP-BC Adult Gerontology Nurse Practitioner [facility], [facility]     Hypertension Hypertension, commonly called high blood pressure, is when the force of blood pumping through the arteries is too strong. The arteries are the blood vessels that carry blood from the heart throughout the body. Hypertension forces the heart to work harder to pump blood and may cause arteries to become narrow or stiff. Having untreated or uncontrolled hypertension can cause heart attacks, strokes, kidney disease, and other problems. A blood pressure reading consists of a higher number over a lower number. Ideally, your blood pressure should be below 120/80. The first ("top") number is called the systolic pressure. It is a measure of the pressure in your arteries as your heart beats. The second ("bottom") number is called the diastolic pressure. It is a measure of the pressure in your arteries as the heart relaxes. What are the causes? The cause of this condition is not known. What increases the risk? Some risk factors for high blood pressure are under your control. Others are not. Factors you can change   Smoking.  Having type 2 diabetes mellitus, high cholesterol, or both.  Not getting enough exercise or physical activity.  Being  overweight.  Having too much fat, sugar, calories, or salt (sodium) in your diet.  Drinking too much alcohol. Factors that are difficult or impossible to change   Having chronic kidney disease.  Having a family history of high blood pressure.  Age. Risk increases with age.  Race. You may be at higher risk if you are African-American.  Gender. Men are at higher risk than women before age 15. After age 20, women are at higher risk than men.  Having obstructive sleep apnea.  Stress. What are the signs or symptoms? Extremely high blood pressure (hypertensive crisis) may cause:  Headache.  Anxiety.  Shortness of breath.  Nosebleed.  Nausea and vomiting.  Severe chest pain.  Jerky movements you cannot control (seizures). How is this diagnosed? This condition is diagnosed by measuring your blood pressure while you are seated, with your arm resting on a surface. The cuff of the blood pressure monitor will be placed directly against the skin of your upper arm at the level of your heart. It should be measured at least twice using the same arm. Certain conditions can cause a difference in blood pressure between your right and left arms. Certain factors can cause blood pressure readings to be lower or higher than normal (elevated) for a short period of time:  When your blood pressure is higher when you are in a health care provider's office than when you are at home, this is called white coat hypertension. Most people with this condition do not need medicines.  When your blood pressure is higher at home than when you are in a health care provider's  office, this is called masked hypertension. Most people with this condition may need medicines to control blood pressure. If you have a high blood pressure reading during one visit or you have normal blood pressure with other risk factors:  You may be asked to return on a different day to have your blood pressure checked again.  You may  be asked to monitor your blood pressure at home for 1 week or longer. If you are diagnosed with hypertension, you may have other blood or imaging tests to help your health care provider understand your overall risk for other conditions. How is this treated? This condition is treated by making healthy lifestyle changes, such as eating healthy foods, exercising more, and reducing your alcohol intake. Your health care provider may prescribe medicine if lifestyle changes are not enough to get your blood pressure under control, and if:  Your systolic blood pressure is above 130.  Your diastolic blood pressure is above 80. Your personal target blood pressure may vary depending on your medical conditions, your age, and other factors. Follow these instructions at home: Eating and drinking   Eat a diet that is high in fiber and potassium, and low in sodium, added sugar, and fat. An example eating plan is called the DASH (Dietary Approaches to Stop Hypertension) diet. To eat this way:  Eat plenty of fresh fruits and vegetables. Try to fill half of your plate at each meal with fruits and vegetables.  Eat whole grains, such as whole wheat pasta, brown rice, or whole grain bread. Fill about one quarter of your plate with whole grains.  Eat or drink low-fat dairy products, such as skim milk or low-fat yogurt.  Avoid fatty cuts of meat, processed or cured meats, and poultry with skin. Fill about one quarter of your plate with lean proteins, such as fish, chicken without skin, beans, eggs, and tofu.  Avoid premade and processed foods. These tend to be higher in sodium, added sugar, and fat.  Reduce your daily sodium intake. Most people with hypertension should eat less than 1,500 mg of sodium a day.  Limit alcohol intake to no more than 1 drink a day for nonpregnant women and 2 drinks a day for men. One drink equals 12 oz of beer, 5 oz of wine, or 1 oz of hard liquor. Lifestyle   Work with your health  care provider to maintain a healthy body weight or to lose weight. Ask what an ideal weight is for you.  Get at least 30 minutes of exercise that causes your heart to beat faster (aerobic exercise) most days of the week. Activities may include walking, swimming, or biking.  Include exercise to strengthen your muscles (resistance exercise), such as pilates or lifting weights, as part of your weekly exercise routine. Try to do these types of exercises for 30 minutes at least 3 days a week.  Do not use any products that contain nicotine or tobacco, such as cigarettes and e-cigarettes. If you need help quitting, ask your health care provider.  Monitor your blood pressure at home as told by your health care provider.  Keep all follow-up visits as told by your health care provider. This is important. Medicines   Take over-the-counter and prescription medicines only as told by your health care provider. Follow directions carefully. Blood pressure medicines must be taken as prescribed.  Do not skip doses of blood pressure medicine. Doing this puts you at risk for problems and can make the medicine less effective.  Ask your health care provider about side effects or reactions to medicines that you should watch for. Contact a health care provider if:  You think you are having a reaction to a medicine you are taking.  You have headaches that keep coming back (recurring).  You feel dizzy.  You have swelling in your ankles.  You have trouble with your vision. Get help right away if:  You develop a severe headache or confusion.  You have unusual weakness or numbness.  You feel faint.  You have severe pain in your chest or abdomen.  You vomit repeatedly.  You have trouble breathing. Summary  Hypertension is when the force of blood pumping through your arteries is too strong. If this condition is not controlled, it may put you at risk for serious complications.  Your personal target  blood pressure may vary depending on your medical conditions, your age, and other factors. For most people, a normal blood pressure is less than 120/80.  Hypertension is treated with lifestyle changes, medicines, or a combination of both. Lifestyle changes include weight loss, eating a healthy, low-sodium diet, exercising more, and limiting alcohol. This information is not intended to replace advice given to you by your health care provider. Make sure you discuss any questions you have with your health care provider. Document Released: 09/01/2005 Document Revised: 07/30/2016 Document Reviewed: 07/30/2016 Elsevier Interactive Patient Education  2017 Elsevier Inc.   Blood Pressure Record Sheet Your blood pressure on this visit to the emergency department or clinic is elevated. This does not necessarily mean you have high blood pressure (hypertension), but it does mean that your blood pressure needs to be rechecked. Many times your blood pressure can increase due to illness, pain, anxiety, or other factors. We recommend that you get a series of blood pressure readings done over a period of 5 days. It is best to get a reading in the morning and one in the evening. You should make sure to sit and relax for 1-5 minutes before the reading is taken. Write the readings down and make a follow-up appointment with your health care provider to discuss the results. If there is not a free clinic or a drug store with a blood-pressure-taking machine near you, you can purchase blood-pressure-taking equipment from a drug store. Having one in the home allows you the convenience of taking your blood pressure while you are home and relaxed. Blood Pressure Log Date: _______________________  a.m. _____________________  p.m. _____________________ Date: _______________________  a.m. _____________________  p.m. _____________________ Date: _______________________  a.m. _____________________  p.m.  _____________________ Date: _______________________  a.m. _____________________  p.m. _____________________ Date: _______________________  a.m. _____________________  p.m. _____________________ This information is not intended to replace advice given to you by your health care provider. Make sure you discuss any questions you have with your health care provider. Document Released: 05/31/2003 Document Revised: 08/15/2016 Document Reviewed: 10/25/2013 Elsevier Interactive Patient Education  2017 [employer]

## 2017-01-26 NOTE — Progress Notes (Signed)
Subjective:    Patient ID: Jasmine Pruitt, female    DOB: 08/13/1976, 41 y.o.   MRN: [id number]  Jasmine Pruitt is a 41 y.o. female presenting on 01/26/2017 for Establish Care (elevated blood pressure x117mths)   HPI  Establish care with new provider Previously managed by OB-GYN prior to and during pregnancy.  Hypertension Had high blood pressure during the last 1 month of pregnancy.  Had a normal and healthy delivery without eclampsia or other problems with BP during delivery.  BP still high at postpartum f/u visit and continued her nifedipine through first week of February and tolerated it well without significant side effects.  Plan to start exercising walking with sisters 4 times per week beginning today. Limited salt/low salt diet and cut out hot sauce.  Pt denies headache, lightheadedness, dizziness, changes in vision, chest tightness/pressure, palpitations, sudden loss of speech or loss of consiousness.   Past Medical History:  Diagnosis Date  . Anemia   . Heart murmur    as a child - resolved  . Hypertension    Past Surgical History:  Procedure Laterality Date  . WISDOM TOOTH EXTRACTION     Social History   Social History  . Marital status: Single    Spouse name: N/A  . Number of children: N/A  . Years of education: N/A   Occupational History  . Not on file.   Social History Main Topics  . Smoking status: Never Smoker  . Smokeless tobacco: Never Used  . Alcohol use Yes     Comment: occasionally  . Drug use: No  . Sexual activity: Yes    Birth control/ protection: Injection   Other Topics Concern  . Not on file   Social History Narrative  . No narrative on file   Family History  Problem Relation Age of Onset  . Diabetes Mother   . Schizophrenia Mother   . Hypertension Father    Current Outpatient Prescriptions on File Prior to Visit  Medication Sig  . prenatal vitamin w/FE, FA (PRENATAL 1 + 1) 27-1 MG TABS tablet Take 1 tablet by mouth daily at 12  noon.  . medroxyPROGESTERone (DEPO-PROVERA) 150 MG/ML injection Inject 1 mL (150 mg total) into the muscle once.  Marland Kitchen. NIFEdipine (PROCARDIA-XL/ADALAT-CC/NIFEDICAL-XL) 30 MG 24 hr tablet Take 1 tablet (30 mg total) by mouth daily. (Patient not taking: Reported on 01/26/2017)   No current facility-administered medications on file prior to visit.     Review of Systems  Constitutional: Negative.   HENT: Negative.   Eyes: Negative.   Respiratory: Negative.   Cardiovascular: Negative.   Gastrointestinal: Negative.   Endocrine: Negative.   Genitourinary: Negative.   Musculoskeletal: Negative.   Skin: Negative.   Allergic/Immunologic: Negative.   Neurological: Negative.   Hematological: Negative.   Psychiatric/Behavioral: Negative.    Per HPI unless specifically indicated above     Objective:    BP (!) 157/98   Pulse 71   Ht 5\' 9"  (1.753 m)   Wt 157 lb 12.8 oz (71.6 kg)   LMP 12/27/2016   Breastfeeding? No   BMI 23.30 kg/m    BP recheck 160/90  Wt Readings from Last 3 Encounters:  01/26/17 157 lb 12.8 oz (71.6 kg)  09/07/16 164 lb (74.4 kg)  09/06/16 164 lb (74.4 kg)    Physical Exam  Constitutional: She is oriented to person, place, and time. She appears well-developed and well-nourished. No distress.  HENT:  Head: Normocephalic and atraumatic.  Right Ear: External  ear normal.  Left Ear: External ear normal.  Mouth/Throat: Oropharynx is clear and moist.  Eyes: Conjunctivae and EOM are normal. Pupils are equal, round, and reactive to light.  Neck: Normal range of motion. Neck supple. No JVD present. No tracheal deviation present.  Cardiovascular: Normal rate and regular rhythm.   Murmur heard. Holosystolic grade 2/6 murmur heard at apex  Pulmonary/Chest: Effort normal and breath sounds normal. No respiratory distress. She has no wheezes.  Lymphadenopathy:    She has no cervical adenopathy.  Neurological: She is alert and oriented to person, place, and time.  Skin:  Skin is warm and dry.  Psychiatric: She has a normal mood and affect. Her behavior is normal. Judgment and thought content normal.          Assessment & Plan:   Problem List Items Addressed This Visit      Cardiovascular and Mediastinum   Essential hypertension - Primary Onset during pregnancy with need for ongoing medication management.  Currently above goal with BP >150/90.    Plan: 1. Start hydrochlorothiazide today.  Take one capsule once daily. 2. Recent CMP in December with normal Kidney function. 3. Follow up in 4 weeks, recheck CMP at that time.   Relevant Medications   hydrochlorothiazide (MICROZIDE) 12.5 MG capsule    Other Visit Diagnoses    Encounter to establish care     Pt without PCP prior to pregnancy.  Received care at her OBGYN.      Meds ordered this encounter  Medications  . hydrochlorothiazide (MICROZIDE) 12.5 MG capsule    Sig: Take 1 capsule (12.5 mg total) by mouth daily.    Dispense:  30 capsule    Refill:  0    Order Specific Question:   Supervising Provider    Answer:   Smitty Cords [[id number]]      Follow up plan: Return in about 4 weeks (around 02/23/2017) for hypertension.  Wilhelmina Mcardle, DNP, AGPCNP-BC Adult Gerontology Primary Care Nurse Practitioner [facility] 01/26/2017, 2:24 PM

## 2017-01-26 NOTE — Progress Notes (Signed)
I have reviewed this encounter including the documentation in this note and/or discussed this patient with the provider, Wilhelmina McardleLauren Kennedy, AGPCNP-BC. I am certifying that I agree with the content of this note as supervising physician.  Saralyn PilarAlexander Arnaldo Heffron, DO [facility] 01/26/2017, 9:11 PM

## 2017-03-02 ENCOUNTER — Encounter: Payer: Self-pay | Admitting: Nurse Practitioner

## 2017-03-02 ENCOUNTER — Ambulatory Visit (INDEPENDENT_AMBULATORY_CARE_PROVIDER_SITE_OTHER): Payer: BC Managed Care – PPO | Admitting: Nurse Practitioner

## 2017-03-02 DIAGNOSIS — I10 Essential (primary) hypertension: Secondary | ICD-10-CM | POA: Diagnosis not present

## 2017-03-02 MED ORDER — HYDROCHLOROTHIAZIDE 25 MG PO TABS: 25.0000 mg | ORAL_TABLET | Freq: Every day | ORAL | 5 refills | Status: DC

## 2017-03-02 MED ORDER — BD ASSURE BPM/AUTO ARM CUFF MISC: 1.0000 [IU] | 0 refills | Status: DC

## 2017-03-02 NOTE — Patient Instructions (Addendum)
Jasmine Pruitt, Thank you for coming in to clinic today.  1. You are doing great with improving your blood pressure. - INCREASE your hydrochorothiazide to 25 mg daily.   With your new fill, this will be one tablet. Until you get your new medicine, take two tablets of your current medicine.  2. Start checking your blood pressure at home about 1-2 times per week. GOAL: both numbers less than 130/80  IF you check your BP and it is less than 110/70, we may have you on too much medicine.  A low blood pressure is less than 90/60.  If it is ever this low, skip your dose and call clinic.  If you don't get a BP cuff at home, come to clinic for CMA visit to check in the next 2 weeks.  Please schedule a follow-up appointment with Wilhelmina McardleLauren Mandell Pangborn, AGNP to Return in about 3 months (around 06/02/2017) for Blood Pressure.  If you have any other questions or concerns, please feel free to call the clinic or send a message through MyChart. You may also schedule an earlier appointment if necessary.  Wilhelmina McardleLauren Dillan Lunden, DNP, AGNP-BC Adult Gerontology Nurse Practitioner [facility], [facility]    How to Take Your Blood Pressure Blood pressure is a measurement of how strongly your blood is pressing against the walls of your arteries. Arteries are blood vessels that carry blood from your heart throughout your body. Your health care provider takes your blood pressure at each office visit. You can also take your own blood pressure at home with a blood pressure machine. You may need to take your own blood pressure:  To confirm a diagnosis of high blood pressure (hypertension).  To monitor your blood pressure over time.  To make sure your blood pressure medicine is working.  Supplies needed: To take your blood pressure, you will need a blood pressure machine. You can buy a blood pressure machine, or blood pressure monitor, at most drugstores or online. There are several types of home blood pressure monitors. When  choosing one, consider the following:  Choose a monitor that has an arm cuff.  Choose a monitor that wraps snugly around your upper arm. You should be able to fit only one finger between your arm and the cuff.  Do not choose a monitor that measures your blood pressure from your wrist or finger.  Your health care provider can suggest a reliable monitor that will meet your needs. How to prepare To get the most accurate reading, avoid the following for 30 minutes before you check your blood pressure:  Drinking caffeine.  Drinking alcohol.  Eating.  Smoking.  Exercising.  Five minutes before you check your blood pressure:  Empty your bladder.  Sit quietly without talking in a dining chair, rather than in a soft couch or armchair.  How to take your blood pressure To check your blood pressure, follow the instructions in the manual that came with your blood pressure monitor. If you have a digital blood pressure monitor, the instructions may be as follows: 1. Sit up straight. 2. Place your feet on the floor. Do not cross your ankles or legs. 3. Rest your left arm at the level of your heart on a table or desk or on the arm of a chair. 4. Pull up your shirt sleeve. 5. Wrap the blood pressure cuff around the upper part of your left arm, 1 inch (2.5 cm) above your elbow. It is best to wrap the cuff around bare skin.  6. Fit the cuff snugly around your arm. You should be able to place only one finger between the cuff and your arm. 7. Position the cord inside the groove of your elbow. 8. Press the power button. 9. Sit quietly while the cuff inflates and deflates. 10. Read the digital reading on the monitor screen and write it down (record it). 11. Wait 2-3 minutes, then repeat the steps, starting at step 1.  What does my blood pressure reading mean? A blood pressure reading consists of a higher number over a lower number. Ideally, your blood pressure should be below 120/80. The first  ("top") number is called the systolic pressure. It is a measure of the pressure in your arteries as your heart beats. The second ("bottom") number is called the diastolic pressure. It is a measure of the pressure in your arteries as the heart relaxes. Blood pressure is classified into four stages. The following are the stages for adults who do not have a short-term serious illness or a chronic condition. Systolic pressure and diastolic pressure are measured in a unit called mm Hg. Normal  Systolic pressure: below 120.  Diastolic pressure: below 80. Elevated  Systolic pressure: 120-129.  Diastolic pressure: below 80. Hypertension stage 1  Systolic pressure: 130-139.  Diastolic pressure: 80-89. Hypertension stage 2  Systolic pressure: 140 or above.  Diastolic pressure: 90 or above. You can have prehypertension or hypertension even if only the systolic or only the diastolic number in your reading is higher than normal. Follow these instructions at home:  Check your blood pressure as often as recommended by your health care provider.  Take your monitor to the next appointment with your health care provider to make sure: ? That you are using it correctly. ? That it provides accurate readings.  Be sure you understand what your goal blood pressure numbers are.  Tell your health care provider if you are having any side effects from blood pressure medicine. Contact a health care provider if:  Your blood pressure is consistently high. Get help right away if:  Your systolic blood pressure is higher than 180.  Your diastolic blood pressure is higher than 110. This information is not intended to replace advice given to you by your health care provider. Make sure you discuss any questions you have with your health care provider. Document Released: 02/08/2016 Document Revised: 04/22/2016 Document Reviewed: 02/08/2016 Elsevier Interactive Patient Education  [employer].

## 2017-03-02 NOTE — Assessment & Plan Note (Signed)
Improving and almost at goal of less than 130/80.  Pt not checking at home r/t no BP cuff.  Tolerating HCTZ well w/o side effects.  Improving diet and trying to increase physical activity to 4 days per week.  Pt w/o obtaining labs after last visit.  Lab open today, draw labs today. Pt is fasting.  Plan: 1. Increase HCTZ to 25 mg once daily. 2. Reinforced Heart healthy diet and increasing physical activity. 3. CMP, Lipid, TSH panel today.  Pt postpartum w/ no prior hypertension and no TSH check. 4. Start checking BP at home.  Reviewed goals < 130/80.  Call clinic if BP < 110/70 - may decrease medication. 5. Follow up 3 months.

## 2017-03-02 NOTE — Progress Notes (Signed)
I have reviewed this encounter including the documentation in this note and/or discussed this patient with the provider, Wilhelmina McardleLauren Kennedy, AGPCNP-BC. I am certifying that I agree with the content of this note as supervising physician.  Saralyn PilarAlexander Reygan Heagle, DO [facility] 03/02/2017, 5:01 PM

## 2017-03-02 NOTE — Progress Notes (Signed)
Subjective:    Patient ID: Jasmine Pruitt, female    DOB: [birth year], 41 y.o.   MRN: [id number]  Kimaya Whitlatch is a 41 y.o. female presenting on 03/02/2017 for Hypertension   HPI  Hypertension Pt is feeling much better.  Does not have BP cuff at home.  Pt denies headache, lightheadedness, dizziness, changes in vision, chest tightness/pressure, palpitations, leg swelling, sudden loss of speech or loss of consciousness.  Current medications: hydrochlorothiazide 12.5 mg once daily, tolerating well without side effects Is doing fairly well with incorporating DASH diet.  States diet is already fairly low in salt and working to decrease fat.   Social History  Substance Use Topics  . Smoking status: Never Smoker  . Smokeless tobacco: Never Used  . Alcohol use Yes     Comment: occasionally    Review of Systems Per HPI unless specifically indicated above     Objective:    BP (!) 135/93 (BP Location: Right Arm, Patient Position: Sitting, Cuff Size: Normal)   Pulse 69   Temp 98.3 F (36.8 C) (Oral)   Ht 5\' 9"  (1.753 m)   Wt 155 lb 3.2 oz (70.4 kg)   BMI 22.92 kg/m    Wt Readings from Last 3 Encounters:  03/02/17 155 lb 3.2 oz (70.4 kg)  01/26/17 157 lb 12.8 oz (71.6 kg)  09/07/16 164 lb (74.4 kg)    Physical Exam  Constitutional: She is oriented to person, place, and time. She appears well-developed and well-nourished. No distress.  HENT:  Head: Normocephalic and atraumatic.  Cardiovascular: Normal rate, regular rhythm, normal heart sounds and intact distal pulses.   Pulmonary/Chest: Effort normal and breath sounds normal. No respiratory distress.  Neurological: She is alert and oriented to person, place, and time.  Skin: Skin is warm and dry.  Psychiatric: She has a normal mood and affect. Her behavior is normal. Judgment and thought content normal.  Vitals reviewed.   Results for orders placed or performed during the hospital encounter of 09/07/16  Urine culture    Result Value Ref Range   Specimen Description URINE, RANDOM    Special Requests NONE    Culture MULTIPLE SPECIES PRESENT, SUGGEST RECOLLECTION (A)    Report Status 09/09/2016 FINAL   CBC with Differential  Result Value Ref Range   WBC 8.5 3.6 - 11.0 K/uL   RBC 5.07 3.80 - 5.20 MIL/uL   Hemoglobin 14.3 12.0 - 16.0 g/dL   HCT 40.9 81.1 - 91.4 %   MCV 86.4 80.0 - 100.0 fL   MCH 28.1 26.0 - 34.0 pg   MCHC 32.6 32.0 - 36.0 g/dL   RDW 78.2 95.6 - 21.3 %   Platelets 364 150 - 440 K/uL   Neutrophils Relative % 72 %   Neutro Abs 6.1 1.4 - 6.5 K/uL   Lymphocytes Relative 24 %   Lymphs Abs 2.1 1.0 - 3.6 K/uL   Monocytes Relative 4 %   Monocytes Absolute 0.3 0.2 - 0.9 K/uL   Eosinophils Relative 0 %   Eosinophils Absolute 0.0 0 - 0.7 K/uL   Basophils Relative 0 %   Basophils Absolute 0.0 0 - 0.1 K/uL  Comprehensive metabolic panel  Result Value Ref Range   Sodium 139 135 - 145 mmol/L   Potassium 3.5 3.5 - 5.1 mmol/L   Chloride 104 101 - 111 mmol/L   CO2 26 22 - 32 mmol/L   Glucose, Bld 85 65 - 99 mg/dL   BUN 8 6 - 20 mg/dL  Creatinine, Ser 0.75 0.44 - 1.00 mg/dL   Calcium 9.8 8.9 - 16.110.3 mg/dL   Total Protein 7.9 6.5 - 8.1 g/dL   Albumin 3.8 3.5 - 5.0 g/dL   AST 30 15 - 41 U/L   ALT 33 14 - 54 U/L   Alkaline Phosphatase 114 38 - 126 U/L   Total Bilirubin 1.2 0.3 - 1.2 mg/dL   GFR calc non Af Amer >60 >60 mL/min   GFR calc Af Amer >60 >60 mL/min   Anion gap 9 5 - 15  Urinalysis, Complete w Microscopic  Result Value Ref Range   Color, Urine YELLOW (A) YELLOW   APPearance HAZY (A) CLEAR   Specific Gravity, Urine 1.009 1.005 - 1.030   pH 6.0 5.0 - 8.0   Glucose, UA NEGATIVE NEGATIVE mg/dL   Hgb urine dipstick LARGE (A) NEGATIVE   Bilirubin Urine NEGATIVE NEGATIVE   Ketones, ur NEGATIVE NEGATIVE mg/dL   Protein, ur NEGATIVE NEGATIVE mg/dL   Nitrite NEGATIVE NEGATIVE   Leukocytes, UA LARGE (A) NEGATIVE   RBC / HPF 0-5 0 - 5 RBC/hpf   WBC, UA 6-30 0 - 5 WBC/hpf   Bacteria,  UA NONE SEEN NONE SEEN   Squamous Epithelial / LPF 0-5 (A) NONE SEEN   Mucous PRESENT       Assessment & Plan:   Problem List Items Addressed This Visit      Cardiovascular and Mediastinum   Essential hypertension Improving and almost at goal of less than 130/80.  Pt not checking at home r/t no BP cuff.  Tolerating HCTZ well w/o side effects.  Improving diet and trying to increase physical activity to 4 days per week.  Pt w/o obtaining labs after last visit.  Lab open today, draw labs today. Pt is fasting.  Plan: 1. Increase HCTZ to 25 mg once daily. 2. Reinforced Heart healthy diet and increasing physical activity. 3. CMP, Lipid, TSH panel today.  Pt postpartum w/ no prior hypertension and no TSH check. 4. Start checking BP at home.  Reviewed goals < 130/80.  Call clinic if BP < 110/70 - may decrease medication. 5. Follow up 3 months.     Relevant Medications   hydrochlorothiazide (HYDRODIURIL) 25 MG tablet   Other Relevant Orders   Lipid panel   Comprehensive metabolic panel   TSH      Meds ordered this encounter  Medications  . Blood Pressure Monitoring (B-D ASSURE BPM/AUTO ARM CUFF) MISC    Sig: 1 Units by Does not apply route 2 (two) times a week.    Dispense:  1 each    Refill:  0    Order Specific Question:   Supervising Provider    Answer:   Smitty CordsKARAMALEGOS, ALEXANDER J [[id number]]  . hydrochlorothiazide (HYDRODIURIL) 25 MG tablet    Sig: Take 1 tablet (25 mg total) by mouth daily.    Dispense:  30 tablet    Refill:  5    Order Specific Question:   Supervising Provider    Answer:   Smitty CordsKARAMALEGOS, ALEXANDER J [[id number]]      Follow up plan: Return in about 3 months (around 06/02/2017) for Blood Pressure.   Wilhelmina McardleLauren Meghanne Pletz, DNP, AGPCNP-BC Adult Gerontology Primary Care Nurse Practitioner [facility] 03/02/2017, 8:15 AM

## 2017-03-03 LAB — COMPREHENSIVE METABOLIC PANEL
ALT: 15 U/L (ref 6–29)
AST: 17 U/L (ref 10–30)
Albumin: 4.4 g/dL (ref 3.6–5.1)
Alkaline Phosphatase: 46 U/L (ref 33–115)
BUN: 11 mg/dL (ref 7–25)
CO2: 22 mmol/L (ref 20–31)
Calcium: 9.3 mg/dL (ref 8.6–10.2)
Chloride: 105 mmol/L (ref 98–110)
Creat: 0.77 mg/dL (ref 0.50–1.10)
Glucose, Bld: 87 mg/dL (ref 65–99)
Potassium: 3.7 mmol/L (ref 3.5–5.3)
Sodium: 142 mmol/L (ref 135–146)
Total Bilirubin: 0.4 mg/dL (ref 0.2–1.2)
Total Protein: 7 g/dL (ref 6.1–8.1)

## 2017-03-03 LAB — LIPID PANEL
Cholesterol: 189 mg/dL (ref <200)
HDL: 74 mg/dL (ref >50)
LDL Cholesterol: 103 mg/dL — ABNORMAL HIGH (ref <100)
Total CHOL/HDL Ratio: 2.6 Ratio (ref <5.0)
Triglycerides: 60 mg/dL (ref <150)
VLDL: 12 mg/dL (ref <30)

## 2017-03-03 LAB — TSH: TSH: 0.49 mIU/L

## 2017-03-10 ENCOUNTER — Ambulatory Visit: Payer: Self-pay | Admitting: Unknown Physician Specialty

## 2017-06-02 ENCOUNTER — Ambulatory Visit: Payer: BC Managed Care – PPO | Admitting: Nurse Practitioner

## 2017-06-08 ENCOUNTER — Ambulatory Visit: Payer: BC Managed Care – PPO | Admitting: Nurse Practitioner

## 2017-06-09 ENCOUNTER — Ambulatory Visit (INDEPENDENT_AMBULATORY_CARE_PROVIDER_SITE_OTHER): Payer: BC Managed Care – PPO | Admitting: Nurse Practitioner

## 2017-06-09 ENCOUNTER — Encounter: Payer: Self-pay | Admitting: Nurse Practitioner

## 2017-06-09 VITALS — BP 126/79 | HR 66 | Temp 98.2°F | Ht 69.0 in | Wt 151.6 lb

## 2017-06-09 DIAGNOSIS — I10 Essential (primary) hypertension: Secondary | ICD-10-CM | POA: Diagnosis not present

## 2017-06-09 DIAGNOSIS — B3731 Acute candidiasis of vulva and vagina: Secondary | ICD-10-CM

## 2017-06-09 DIAGNOSIS — Z3042 Encounter for surveillance of injectable contraceptive: Secondary | ICD-10-CM | POA: Diagnosis not present

## 2017-06-09 DIAGNOSIS — B373 Candidiasis of vulva and vagina: Secondary | ICD-10-CM

## 2017-06-09 MED ORDER — FLUCONAZOLE 150 MG PO TABS: 150.0000 mg | ORAL_TABLET | Freq: Once | ORAL | 0 refills | Status: AC

## 2017-06-09 MED ORDER — HYDROCHLOROTHIAZIDE 25 MG PO TABS: 25.0000 mg | ORAL_TABLET | Freq: Every day | ORAL | 2 refills | Status: DC

## 2017-06-09 NOTE — Progress Notes (Signed)
Subjective:    Patient ID: Jasmine Pruitt, female    DOB: April 03, 1976, 41 y.o.   MRN: [id number]  Jasmine Pruitt is a 41 y.o. female presenting on 06/09/2017 for Hypertension   HPI Hypertension - She is not checking BP at home or outside of clinic.   - Current medications: hydrochlorothiazide 25 mg once daily, tolerating well without side effects - She is not currently symptomatic. - Pt denies headache, lightheadedness, dizziness, changes in vision, chest tightness/pressure, palpitations, leg swelling, sudden loss of speech or loss of consciousness. - She  reports no regular exercise routine. Outside of work.  Works 2 jobs now. - Her diet is moderate in salt, moderate in fat, and moderate in carbohydrates.   Depo-Provera Has been on this medication off and on x 20 years.  States has not been on it continuously > 3 years at a time. Would like to continue not having periods.  Wants to continue at least through our next appointment in March. - Denies any bone fractures.  Vaginal discharge No odor, itchy.  Started about 2 weeks ago.  Social History  Substance Use Topics  . Smoking status: Never Smoker  . Smokeless tobacco: Never Used  . Alcohol use Yes     Comment: occasionally    Review of Systems Per HPI unless specifically indicated above     Objective:    BP 126/79 (BP Location: Right Arm, Patient Position: Sitting, Cuff Size: Normal)   Pulse 66   Temp 98.2 F (36.8 C) (Oral)   Ht  (1.753 m)   Wt 151 lb 9.6 oz (68.8 kg)   BMI 22.39 kg/m   Wt Readings from Last 3 Encounters:  06/09/17 151 lb 9.6 oz (68.8 kg)  03/02/17 155 lb 3.2 oz (70.4 kg)  01/26/17 157 lb 12.8 oz (71.6 kg)    Physical Exam General - healthy, well-appearing, NAD HEENT - Normocephalic, atraumatic Neck - supple, non-tender, no LAD, no thyromegaly Heart - RRR, no murmurs heard Lungs - Clear throughout all lobes, no wheezing, crackles, or rhonchi. Normal work of breathing. GU - Normal external  female genitalia without lesions or fusion.  Thick, white discharge present between labia majora and labia minora. Thick, white vaginal discharge on exam. Bimanual exam without adnexal masses, enlarged uterus, or cervical motion tenderness. Extremeties - non-tender,  no edema, cap refill < 2 seconds, peripheral pulses intact +2 bilaterally Skin - warm, dry Neuro - awake, alert, oriented x3, normal gait Psych - Normal mood and affect, normal behavior   Results for orders placed or performed in visit on 06/09/17  POCT Wet Prep [facility])  Result Value Ref Range   Source Wet Prep POC vagina    WBC, Wet Prep HPF POC none    Bacteria Wet Prep HPF POC Few Few   BACTERIA WET PREP MORPHOLOGY POC     Clue Cells Wet Prep HPF POC None None   Clue Cells Wet Prep Whiff POC     Yeast Wet Prep HPF POC Moderate    KOH Wet Prep POC     Trichomonas Wet Prep HPF POC Absent Absent      Assessment & Plan:   Problem List Items Addressed This Visit      Cardiovascular and Mediastinum   Essential hypertension - Primary    Controlled and at goal of less than 130/80.  Pt not checking at home.  Tolerating HCTZ well w/o side effects.  Improving diet and trying to increase physical activity to  4 days per week.  Plan: 1. Increase HCTZ to 25 mg once daily. 2. Reinforced Heart healthy diet and increasing physical activity. 3. Start checking BP at home.  Reviewed goals < 130/80.  Call clinic if BP < 110/70 - may decrease medication. 4. Follow up 6 months.        Relevant Medications   hydrochlorothiazide (HYDRODIURIL) 25 MG tablet    Other Visit Diagnoses    Vaginal candidiasis       Acute vaginal candidiasis w/ white thick discharge on external vulva and positive wet prep for yeast buds.  1. Take 150 mg diflucan once today & in 3 days   Relevant Orders   POCT Wet Prep [facility]) (Completed)   Encounter for surveillance of injectable contraceptive       Pt desires to continue depo  provera.  Plan: 1. discussed risks of osteoporosis w/ long-term use. 2. Pt may consider OCP at next appointment in March. 3. Come to clinic w/ medication every 3 months for injection.   Relevant Medications   medroxyPROGESTERone (DEPO-PROVERA) 150 MG/ML injection      Meds ordered this encounter  Medications  . DISCONTD: amoxicillin (AMOXIL) 500 MG capsule    Sig: TK ONE C PO TID UNTIL GONE    Refill:  0  . Multiple Vitamin (MULTIVITAMIN) tablet    Sig: Take 1 tablet by mouth daily.  . hydrochlorothiazide (HYDRODIURIL) 25 MG tablet    Sig: Take 1 tablet (25 mg total) by mouth daily.    Dispense:  90 tablet    Refill:  2  . fluconazole (DIFLUCAN) 150 MG tablet    Sig: Take 1 tablet (150 mg total) by mouth once. Then repeat in 3 days.    Dispense:  2 tablet    Refill:  0  . medroxyPROGESTERone (DEPO-PROVERA) 150 MG/ML injection    Sig: Inject 1 mL (150 mg total) into the muscle once.    Dispense:  1 mL    Refill:  2    Fill when pt requests refill      Follow up plan: Return in about 6 months (around 12/07/2017) for hypertension.  Wilhelmina Mcardle, DNP, AGPCNP-BC Adult Gerontology Primary Care Nurse Practitioner [facility] 06/13/2017, 8:28 PM

## 2017-06-09 NOTE — Patient Instructions (Addendum)
XWRUEA, Thank you for coming in to clinic today.  1. You have a vaginal yeast infection - take diflucan  once today and repeat on Friday if you still have discharge.  2. Hypertension - Continue hydrochlorothiazide 25 mg once daily - Keep up your good eating and physical activity.  Please schedule a follow-up appointment with Wilhelmina Mcardle, AGNP. Return in about 6 months (around 12/07/2017) for hypertension.  If you have any other questions or concerns, please feel free to call the clinic or send a message through MyChart. You may also schedule an earlier appointment if necessary.  You will receive a survey after today's visit either digitally by e-mail or paper by [employer]. Your experiences and feedback matter to [place].  Please respond so we know how we are doing as we provide care for you.   Wilhelmina Mcardle, DNP, AGNP-BC Adult Gerontology Nurse Practitioner [facility], [facility]

## 2017-06-13 LAB — POCT WET PREP (WET MOUNT): Trichomonas Wet Prep HPF POC: ABSENT

## 2017-06-13 MED ORDER — MEDROXYPROGESTERONE ACETATE 150 MG/ML IM SUSP: 150.0000 mg | Freq: Once | INTRAMUSCULAR | 2 refills | Status: DC

## 2017-06-13 NOTE — Assessment & Plan Note (Signed)
Controlled and at goal of less than 130/80.  Pt not checking at home.  Tolerating HCTZ well w/o side effects.  Improving diet and trying to increase physical activity to 4 days per week.  Plan: 1. Increase HCTZ to 25 mg once daily. 2. Reinforced Heart healthy diet and increasing physical activity. 3. Start checking BP at home.  Reviewed goals < 130/80.  Call clinic if BP < 110/70 - may decrease medication. 4. Follow up 6 months.

## 2017-06-16 ENCOUNTER — Ambulatory Visit: Payer: BC Managed Care – PPO | Admitting: Nurse Practitioner

## 2017-06-18 ENCOUNTER — Encounter: Payer: Self-pay | Admitting: Nurse Practitioner

## 2017-06-18 ENCOUNTER — Ambulatory Visit (INDEPENDENT_AMBULATORY_CARE_PROVIDER_SITE_OTHER): Payer: BC Managed Care – PPO | Admitting: Nurse Practitioner

## 2017-06-18 VITALS — BP 129/83 | HR 73 | Temp 98.4°F | Ht 69.0 in | Wt 153.8 lb

## 2017-06-18 DIAGNOSIS — L209 Atopic dermatitis, unspecified: Secondary | ICD-10-CM

## 2017-06-18 MED ORDER — TRIAMCINOLONE ACETONIDE 0.1 % EX CREA: 1.0000 "application " | TOPICAL_CREAM | Freq: Two times a day (BID) | CUTANEOUS | 0 refills | Status: DC

## 2017-06-18 NOTE — Patient Instructions (Addendum)
ZOXWRU, Thank you for coming in to clinic today.  1. START triamcinolone ointment 0.1% apply to affected skin twice daily x 14 days.    2. START over the counter Claritin, Allegra, or Zyrtec once daily x 14 days.  3. Call if itching does not improve in 2-3 days, and I can send hydroxyzine for you.  Please schedule a follow-up appointment with Wilhelmina Mcardle, AGNP. Return if symptoms worsen or fail to improve.  If you have any other questions or concerns, please feel free to call the clinic or send a message through MyChart. You may also schedule an earlier appointment if necessary.  You will receive a survey after today's visit either digitally by e-mail or paper by [employer]. Your experiences and feedback matter to [place].  Please respond so we know how we are doing as we provide care for you.   Wilhelmina Mcardle, DNP, AGNP-BC Adult Gerontology Nurse Practitioner [facility], [facility]   Eczema Eczema, also called atopic dermatitis, is a skin disorder that causes inflammation of the skin. It causes a red rash and dry, scaly skin. The skin becomes very itchy. Eczema is generally worse during the cooler winter months and often improves with the warmth of summer. Eczema usually starts showing signs in infancy. Some children outgrow eczema, but it may last through adulthood. What are the causes? The exact cause of eczema is not known, but it appears to run in families. People with eczema often have a family history of eczema, allergies, asthma, or hay fever. Eczema is not contagious. Flare-ups of the condition may be caused by:  Contact with something you are sensitive or allergic to.  Stress.  What are the signs or symptoms?  Dry, scaly skin.  Red, itchy rash.  Itchiness. This may occur before the skin rash and may be very intense. How is this diagnosed? The diagnosis of eczema is usually made based on symptoms and medical history. How is this treated? Eczema cannot be  cured, but symptoms usually can be controlled with treatment and other strategies. A treatment plan might include:  Controlling the itching and scratching. ? Use over-the-counter antihistamines as directed for itching. This is especially useful at night when the itching tends to be worse. ? Use over-the-counter steroid creams as directed for itching. ? Avoid scratching. Scratching makes the rash and itching worse. It may also result in a skin infection (impetigo) due to a break in the skin caused by scratching.  Keeping the skin well moisturized with creams every day. This will seal in moisture and help prevent dryness. Lotions that contain alcohol and water should be avoided because they can dry the skin.  Limiting exposure to things that you are sensitive or allergic to (allergens).  Recognizing situations that cause stress.  Developing a plan to manage stress.  Follow these instructions at home:  Only take over-the-counter or prescription medicines as directed by your health care provider.  Do not use anything on the skin without checking with your health care provider.  Keep baths or showers short (5 minutes) in warm (not hot) water. Use mild cleansers for bathing. These should be unscented. You may add nonperfumed bath oil to the bath water. It is best to avoid soap and bubble bath.  Immediately after a bath or shower, when the skin is still damp, apply a moisturizing ointment to the entire body. This ointment should be a petroleum ointment. This will seal in moisture and help prevent dryness. The thicker the  ointment, the better. These should be unscented.  Keep fingernails cut short. Children with eczema may need to wear soft gloves or mittens at night after applying an ointment.  Dress in clothes made of cotton or cotton blends. Dress lightly, because heat increases itching.  A child with eczema should stay away from anyone with fever blisters or cold sores. The virus that causes  fever blisters (herpes simplex) can cause a serious skin infection in children with eczema. Contact a health care provider if:  Your itching interferes with sleep.  Your rash gets worse or is not better within 1 week after starting treatment.  You see pus or soft yellow scabs in the rash area.  You have a fever.  You have a rash flare-up after contact with someone who has fever blisters. This information is not intended to replace advice given to you by your health care provider. Make sure you discuss any questions you have with your health care provider. Document Released: 08/29/2000 Document Revised: 02/07/2016 Document Reviewed: 04/04/2013 Elsevier Interactive Patient Education  2017 [employer].

## 2017-06-18 NOTE — Progress Notes (Signed)
Subjective:    Patient ID: Jasmine Pruitt, female    DOB: 19-Jul-1976, 41 y.o.   MRN: [id number]  Jasmine Pruitt is a 41 y.o. female presenting on 06/18/2017 for Rash (two patches on her back that itch x 1 mth )   HPI Rash 1. Pt has had rash on her back in 2 locations x 1 month w/ significant pruritus.  Appears as brown rash.  Is using eucerin cream, but has not had any relief.  Notes has not used vaseline on her skin during summer like she does during fall, winter, spring. 2. 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  Starts as small itchy bumps, then starts peeling.  Pt denies fever, chills, sweats, nausea, vomiting, diarrhea and constipation.  Social History  Substance Use Topics  . Smoking status: Never Smoker  . Smokeless tobacco: Never Used  . Alcohol use Yes     Comment: occasionally    Review of Systems Per HPI unless specifically indicated above     Objective:    BP 129/83 (BP Location: Right Arm, Patient Position: Sitting, Cuff Size: Normal)   Pulse 73   Temp 98.4 F (36.9 C) (Oral)   Ht  (1.753 m)   Wt 153 lb 12.8 oz (69.8 kg)   BMI 22.71 kg/m   Wt Readings from Last 3 Encounters:  06/18/17 153 lb 12.8 oz (69.8 kg)  06/09/17 151 lb 9.6 oz (68.8 kg)  03/02/17 155 lb 3.2 oz (70.4 kg)    Physical Exam  General - healthy, well-appearing, NAD HEENT - Normocephalic, atraumatic Extremeties - non-tender, no edema, cap refill < 2 seconds, peripheral pulses intact +2 bilaterally Skin - warm, dry.  Macular rash of skin on back at lumbar spine and at bra line w/ excoriations of various stages of healing overlying diffuse hyperpigmented area.  Flaking skin noted diffusely over fingertips bilateral hands w/o compromised skin integrity. Neuro - awake, alert, oriented x3, normal gait Psych - Normal mood and affect, normal behavior   Rash left of spine at bra line:    Results  for orders placed or performed in visit on 06/09/17  POCT Wet Prep [facility])  Result Value Ref Range   Source Wet Prep POC vagina    WBC, Wet Prep HPF POC none    Bacteria Wet Prep HPF POC Few Few   BACTERIA WET PREP MORPHOLOGY POC     Clue Cells Wet Prep HPF POC None None   Clue Cells Wet Prep Whiff POC     Yeast Wet Prep HPF POC Moderate    KOH Wet Prep POC     Trichomonas Wet Prep HPF POC Absent Absent      Assessment & Plan:   Problem List Items Addressed This Visit    None    Visit Diagnoses    Atopic dermatitis, unspecified type    -  Primary Pruritic rash on back is likely eczema w/ duration, pruritus.  Hands also have pattern of eczema w/ peeling, water exposure.  Plan: 1. START OTC claritin, allegra, or zyrtec once daily x minimum of 14 days for reduction of histamine response/pruritus. 2. START topical triamcinolone 0.1% twice daily x 14 days.  May use on back and hands. 3. Follow up as needed.    Relevant Medications   triamcinolone cream (KENALOG) 0.1 %      Meds ordered this encounter  Medications  .  medroxyPROGESTERone (DEPO-PROVERA) 150 MG/ML injection  . triamcinolone cream (KENALOG) 0.1 %    Sig: Apply 1 application topically 2 (two) times daily.    Dispense:  30 g    Refill:  0    Order Specific Question:   Supervising Provider    Answer:   Smitty Cords [[id number]]    Follow up plan: Return if symptoms worsen or fail to improve.  Wilhelmina Mcardle, DNP, AGPCNP-BC Adult Gerontology Primary Care Nurse Practitioner [facility] 06/18/2017, 3:38 PM

## 2017-06-26 NOTE — Progress Notes (Signed)
Appointment cancelled no charge to patient.

## 2017-07-21 ENCOUNTER — Ambulatory Visit: Payer: BC Managed Care – PPO

## 2017-10-12 ENCOUNTER — Encounter: Payer: Self-pay | Admitting: Nurse Practitioner

## 2017-10-12 ENCOUNTER — Other Ambulatory Visit: Payer: Self-pay

## 2017-10-12 ENCOUNTER — Ambulatory Visit (INDEPENDENT_AMBULATORY_CARE_PROVIDER_SITE_OTHER): Payer: BC Managed Care – PPO | Admitting: Nurse Practitioner

## 2017-10-12 VITALS — BP 144/22 | HR 68 | Temp 98.5°F | Ht 69.0 in | Wt 147.2 lb

## 2017-10-12 DIAGNOSIS — J00 Acute nasopharyngitis [common cold]: Secondary | ICD-10-CM

## 2017-10-12 MED ORDER — IPRATROPIUM BROMIDE 0.06 % NA SOLN: 2.0000 | Freq: Four times a day (QID) | NASAL | 0 refills | Status: DC

## 2017-10-12 NOTE — Patient Instructions (Addendum)
Jasmine Pruitt, Thank you for coming in to clinic today.  1. It sounds like you have a Upper Respiratory Virus - this will most likely run it's course in 7 to 10 days. Recommend good hand washing. - Start Atrovent nasal spray decongestant 2 sprays each nostril up to 4 times daily for 5-7 days - Start anti-histamine cetirizine 10mg  daily, also can use Flonase 2 sprays each nostril daily for up to 4-6 weeks - If congestion is worse, start OTC Mucinex (or may try Mucinex-DM for cough) up to 7-10 days then stop - Drink plenty of fluids to improve congestion - You may try over the counter Nasal Saline spray (Simply Saline, Ocean Spray) as needed to reduce congestion. - Drink warm herbal tea with honey for sore throat. - Start taking Tylenol extra strength 1 to 2 tablets every 6-8 hours for aches or fever/chills for next few days as needed.  Do not take more than 3,000 mg in 24 hours from all medicines.  May take Ibuprofen as well if tolerated 200-400mg  every 8 hours as needed.  If symptoms significantly worsening with persistent fevers/chills despite tylenol/ibpurofen, nausea, vomiting unable to tolerate food/fluids or medicine, body aches, or shortness of breath, sinus pain pressure or worsening productive cough, then follow-up for re-evaluation, may seek more immediate care at Urgent Care or ED if more concerned for emergency.  Please schedule a follow-up appointment with Wilhelmina McardleLauren Masey Scheiber, AGNP. Return 2-5 days if symptoms worsen or fail to improve.  If you have any other questions or concerns, please feel free to call the clinic or send a message through MyChart. You may also schedule an earlier appointment if necessary.  You will receive a survey after today's visit either digitally by e-mail or paper by [employer] mail. Your experiences and feedback matter to us.  Please respond so we know how we are doing as we provide care for you.   Wilhelmina McardleLauren Blossom Crume, DNP, AGNP-BC Adult Gerontology Nurse Practitioner [facility], [facility]

## 2017-10-12 NOTE — Progress Notes (Signed)
Subjective:    Patient ID: Jasmine Pruitt, female    DOB: [birth year], 42 y.o.   MRN: [id number]  Jasmine Pruitt is a 42 y.o. female presenting on 10/12/2017 for Cough (headache congestion, stuffy nose, eye drainage, and right ear itching x 5 days  )   HPI URI Patient reports onset of initial symptoms 5 days ago on Wednesday.  Symptoms included headache, sinus congestion, nasal congestion, sinus pressure, rhinorrhea, and excessive eye tearing. These symptoms have progressively worsened with new onset yesterday, of ear itching and cough.  Pt notes now her chest hurts with cough. - She took Theraflu yesterday and is helping some. Was better for sleep, but she notes it is not helping symptoms go away.   Social History   Tobacco Use  . Smoking status: Never Smoker  . Smokeless tobacco: Never Used  Substance Use Topics  . Alcohol use: Yes    Comment: occasionally  . Drug use: No    Comment: occasional marijuana 1-2 x per month    Review of Systems Per HPI unless specifically indicated above     Objective:    BP (!) 144/22 (BP Location: Right Arm, Patient Position: Sitting, Cuff Size: Normal)   Pulse 68   Temp 98.5 F (36.9 C) (Oral)   Ht 5\' 9"  (1.753 m)   Wt 147 lb 3.2 oz (66.8 kg)   SpO2 100%   BMI 21.74 kg/m   Wt Readings from Last 3 Encounters:  10/12/17 147 lb 3.2 oz (66.8 kg)  06/18/17 153 lb 12.8 oz (69.8 kg)  06/09/17 151 lb 9.6 oz (68.8 kg)    Physical Exam  Constitutional: She appears well-developed and well-nourished.  Sickly appearing  HENT:  Head: Normocephalic and atraumatic.  Right Ear: Hearing, external ear and ear canal normal. Tympanic membrane is bulging.  Left Ear: Hearing, external ear and ear canal normal. Tympanic membrane is bulging.  Nose: Mucosal edema and rhinorrhea present. Right sinus exhibits maxillary sinus tenderness and frontal sinus tenderness. Left sinus exhibits maxillary sinus tenderness and frontal sinus tenderness.  Mouth/Throat:  Uvula is midline, oropharynx is clear and moist and mucous membranes are normal.     Results for orders placed or performed in visit on 06/09/17  POCT Wet Prep [facility])  Result Value Ref Range   Source Wet Prep POC vagina    WBC, Wet Prep HPF POC none    Bacteria Wet Prep HPF POC Few Few   BACTERIA WET PREP MORPHOLOGY POC     Clue Cells Wet Prep HPF POC None None   Clue Cells Wet Prep Whiff POC     Yeast Wet Prep HPF POC Moderate    KOH Wet Prep POC     Trichomonas Wet Prep HPF POC Absent Absent      Assessment & Plan:   Problem List Items Addressed This Visit    None    Visit Diagnoses    Acute nasopharyngitis    -  Primary Acute illness. Fever responsive to NSAIDs and tylenol.  Symptoms not worsening. Consistent with viral illness x 5 days with known sick contacts and no identifiable focal infections of ears, nose, throat.  Plan: 1. Reassurance, likely self-limited with cough lasting up to few weeks - Start Atrovent nasal spray decongestant 2 sprays each nostril up to 4 times daily for 5-7 days - Start anti-histamine Cetirizine 10mg  daily,  - also can use Flonase 2 sprays each nostril daily for up to 4-6 weeks - Start Mucinex-DM OTC  up to 7-10 days then stop - Supportive care with nasal saline, warm herbal tea with honey, 3. Improve hydration 4. Tylenol / Motrin PRN fevers 5. Return criteria given    Relevant Medications   ipratropium (ATROVENT) 0.06 % nasal spray      Meds ordered this encounter  Medications  . ipratropium (ATROVENT) 0.06 % nasal spray    Sig: Place 2 sprays into both nostrils 4 (four) times daily.    Dispense:  15 mL    Refill:  0    Order Specific Question:   Supervising Provider    Answer:   Smitty CordsKARAMALEGOS, ALEXANDER J [[id number]]      Follow up plan: Return 2-5 days if symptoms worsen or fail to improve.  Wilhelmina McardleLauren Walterine Amodei, DNP, AGPCNP-BC Adult Gerontology Primary Care Nurse Practitioner [facility] 10/12/2017, 8:28 AM

## 2017-12-18 ENCOUNTER — Ambulatory Visit (INDEPENDENT_AMBULATORY_CARE_PROVIDER_SITE_OTHER): Payer: BC Managed Care – PPO | Admitting: Nurse Practitioner

## 2017-12-18 ENCOUNTER — Encounter: Payer: Self-pay | Admitting: Nurse Practitioner

## 2017-12-18 ENCOUNTER — Other Ambulatory Visit: Payer: Self-pay

## 2017-12-18 VITALS — BP 129/76 | HR 66 | Temp 98.0°F | Ht 69.0 in | Wt 147.6 lb

## 2017-12-18 DIAGNOSIS — I10 Essential (primary) hypertension: Secondary | ICD-10-CM | POA: Diagnosis not present

## 2017-12-18 DIAGNOSIS — Z30019 Encounter for initial prescription of contraceptives, unspecified: Secondary | ICD-10-CM | POA: Diagnosis not present

## 2017-12-18 DIAGNOSIS — Z3042 Encounter for surveillance of injectable contraceptive: Secondary | ICD-10-CM | POA: Diagnosis not present

## 2017-12-18 LAB — POCT URINE PREGNANCY: Preg Test, Ur: NEGATIVE

## 2017-12-18 MED ORDER — MEDROXYPROGESTERONE ACETATE 150 MG/ML IM SUSP: 150.0000 mg | Freq: Once | INTRAMUSCULAR | 3 refills | Status: DC

## 2017-12-18 MED ORDER — HYDROCHLOROTHIAZIDE 25 MG PO TABS: 25.0000 mg | ORAL_TABLET | Freq: Every day | ORAL | 2 refills | Status: DC

## 2017-12-18 MED ORDER — MEDROXYPROGESTERONE ACETATE 150 MG/ML IM SUSP
150.0000 mg | Freq: Once | INTRAMUSCULAR | Status: AC
  Administered 2017-12-18: 150 mg via INTRAMUSCULAR

## 2017-12-18 NOTE — Assessment & Plan Note (Signed)
Controlled and at goal of less than 130/80.  Pt not checking at home.  Tolerating HCTZ well w/o side effects.  Improving diet and trying to increase physical activity to 4 days per week.  Plan: 1. Continue HCTZ to 25 mg once daily. 2. Reinforced Heart healthy diet and increasing physical activity. 3. Start checking BP at home.  Reviewed goals < 130/80.  Call clinic if BP < 110/70 - may decrease medication. 4. Follow up 6 months.

## 2017-12-18 NOTE — Progress Notes (Signed)
Subjective:    Patient ID: Jasmine Pruitt, female    DOB: 1976/02/18, 42 y.o.   MRN: [id number]  Jasmine Pruitt is a 42 y.o. female presenting on 12/18/2017 for Contraception (pt been off birth control x 6 mths )   HPI Contraceptive Therapy Has been off depo since Oct/Nov 2018.  Neg pregnancy test in office today.  Pt has discussed other options of hormonal contraception and continues to prefer depo.  We have discussed in detail in past about bone density loss with prolonged use of DepoProvera and pt states this is still her preferred method of contraception.  Is currently sexually active in mutually monogamous relationship.   Hypertension - She is not checking BP at home or outside of clinic.    - Current medications: hydrochlorothiazide 25 mg daily, tolerating well without side effects.  Occasionally misses doses. - She is not currently symptomatic. - Pt denies headache, lightheadedness, dizziness, changes in vision, chest tightness/pressure, palpitations, leg swelling, sudden loss of speech or loss of consciousness. - She  reports an exercise routine that includes walking about 3 days per week. - Her diet is moderate in salt, moderate in fat, and moderate in carbohydrates.    Social History   Tobacco Use  . Smoking status: Never Smoker  . Smokeless tobacco: Never Used  Substance Use Topics  . Alcohol use: Yes    Comment: occasionally  . Drug use: No    Comment: occasional marijuana 1-2 x per month    Review of Systems Per HPI unless specifically indicated above     Objective:    BP 129/76 (BP Location: Right Arm, Patient Position: Sitting, Cuff Size: Normal)   Pulse 66   Temp 98 F (36.7 C) (Oral)   Ht 5\' 9"  (1.753 m)   Wt 147 lb 9.6 oz (67 kg)   BMI 21.80 kg/m   Wt Readings from Last 3 Encounters:  12/18/17 147 lb 9.6 oz (67 kg)  10/12/17 147 lb 3.2 oz (66.8 kg)  06/18/17 153 lb 12.8 oz (69.8 kg)    Physical Exam  Constitutional: She is oriented to person, place,  and time. She appears well-developed and well-nourished. No distress.  HENT:  Head: Normocephalic and atraumatic.  Neck: Normal range of motion. Neck supple. Carotid bruit is not present.  Cardiovascular: Normal rate, regular rhythm, S1 normal, S2 normal, normal heart sounds and intact distal pulses.  Pulmonary/Chest: Effort normal and breath sounds normal. No respiratory distress.  Musculoskeletal: She exhibits no edema (pedal).  Neurological: She is alert and oriented to person, place, and time.  Skin: Skin is warm and dry.  Psychiatric: She has a normal mood and affect. Her behavior is normal. Judgment and thought content normal.  Vitals reviewed.   Results for orders placed or performed in visit on 06/09/17  POCT Wet Prep [facility])  Result Value Ref Range   Source Wet Prep POC vagina    WBC, Wet Prep HPF POC none    Bacteria Wet Prep HPF POC Few Few   BACTERIA WET PREP MORPHOLOGY POC     Clue Cells Wet Prep HPF POC None None   Clue Cells Wet Prep Whiff POC     Yeast Wet Prep HPF POC Moderate    KOH Wet Prep POC     Trichomonas Wet Prep HPF POC Absent Absent      Assessment & Plan:   Problem List Items Addressed This Visit      Cardiovascular and Mediastinum   Essential  hypertension    Controlled and at goal of less than 130/80.  Pt not checking at home.  Tolerating HCTZ well w/o side effects.  Improving diet and trying to increase physical activity to 4 days per week.  Plan: 1. Continue HCTZ to 25 mg once daily. 2. Reinforced Heart healthy diet and increasing physical activity. 3. Start checking BP at home.  Reviewed goals < 130/80.  Call clinic if BP < 110/70 - may decrease medication. 4. Follow up 6 months.        Relevant Medications   hydrochlorothiazide (HYDRODIURIL) 25 MG tablet     Other   Encounter for surveillance of injectable contraceptive    Pt desires to continue depo provera.  Pregnancy test in office today was negative.  Plan: 1. Discussed OCP,  patch, ring, implanon, injection, IUD (hormonal and copper) in detail w/ common side effects of each. 2. Pt prefers to start Depo Provera injection.  Prescription sent.  Pt to return to clinic today for administration. 3. Discussed risks of osteoporosis w/ long-term use. Pt verbalizes understanding. 4. Followup 1 year and every 3 months for injection.      Relevant Medications   medroxyPROGESTERone (DEPO-PROVERA) 150 MG/ML injection    Other Visit Diagnoses    Encounter for female birth control    -  Primary   Relevant Medications   medroxyPROGESTERone (DEPO-PROVERA) injection 150 mg (Completed)   Other Relevant Orders   POCT urine pregnancy      Meds ordered this encounter  Medications  . medroxyPROGESTERone (DEPO-PROVERA) 150 MG/ML injection    Sig: Inject 1 mL (150 mg total) into the muscle once for 1 dose.    Dispense:  1 mL    Refill:  3    Order Specific Question:   Supervising Provider    Answer:   Smitty CordsKARAMALEGOS, ALEXANDER J [[id number]]  . hydrochlorothiazide (HYDRODIURIL) 25 MG tablet    Sig: Take 1 tablet (25 mg total) by mouth daily.    Dispense:  90 tablet    Refill:  2    Order Specific Question:   Supervising Provider    Answer:   Smitty CordsKARAMALEGOS, ALEXANDER J [[id number]]  . medroxyPROGESTERone (DEPO-PROVERA) injection 150 mg    Follow up plan: Return in about 6 months (around 06/19/2018) for hypertension AND with CMA every 3 months for the next year for Depo injections.  Jasmine McardleLauren Nakyra Bourn, DNP, AGPCNP-BC Adult Gerontology Primary Care Nurse Practitioner [facility] 12/18/2017, 5:42 PM

## 2017-12-18 NOTE — Assessment & Plan Note (Signed)
Pt desires to continue depo provera.  Pregnancy test in office today was negative.  Plan: 1. Discussed OCP, patch, ring, implanon, injection, IUD (hormonal and copper) in detail w/ common side effects of each. 2. Pt prefers to start Depo Provera injection.  Prescription sent.  Pt to return to clinic today for administration. 3. Discussed risks of osteoporosis w/ long-term use. Pt verbalizes understanding. 4. Followup 1 year and every 3 months for injection.

## 2017-12-18 NOTE — Patient Instructions (Addendum)
ZOXWRUekida Jasmine Pruitt,   Thank you for coming in to clinic today.  1. Have your Depo Provera administered at least every 86-90 days.  Today dose 1 - 12/18/2017 3 months dose 2 - 7/1-01/2018 6 months dose 3 - 10/1-01/2018 9 months dose 5 - 1/1-01/2018  Please schedule a follow-up appointment with Jasmine Pruitt, AGNP. Return in about 6 months (around 06/19/2018) for hypertension AND with CMA every 3 months for the next year for Depo injections.  If you have any other questions or concerns, please feel free to call the clinic or send a message through MyChart. You may also schedule an earlier appointment if necessary.  You will receive a survey after today's visit either digitally by e-mail or paper by [employer] mail. Your experiences and feedback matter to Jasmine Pruitt.  Please respond so we know how we are doing as we provide care for you.   Jasmine McardleLauren Kendan Cornforth, DNP, AGNP-BC Adult Gerontology Nurse Practitioner [facility]  Medroxyprogesterone injection [Contraceptive] What is this medicine? MEDROXYPROGESTERONE (me DROX ee proe JES te rone) contraceptive injections prevent pregnancy. They provide effective birth control for 3 months. Depo-subQ Provera 104 is also used for treating pain related to endometriosis. This medicine may be used for other purposes; ask your health care provider or pharmacist if you have questions. COMMON BRAND NAME(S): Depo-Provera, Depo-subQ Provera 104 What should I tell my health care provider before I take this medicine? They need to know if you have any of these conditions: -frequently drink alcohol -asthma -blood vessel disease or a history of a blood clot in the lungs or legs -bone disease such as osteoporosis -breast cancer -diabetes -eating disorder (anorexia nervosa or bulimia) -high blood pressure -HIV infection or AIDS -kidney disease -liver disease -mental depression -migraine -seizures (convulsions) -stroke -tobacco smoker -vaginal bleeding -an  unusual or allergic reaction to medroxyprogesterone, other hormones, medicines, foods, dyes, or preservatives -pregnant or trying to get pregnant -breast-feeding How should I use this medicine? Depo-Provera Contraceptive injection is given into a muscle. Depo-subQ Provera 104 injection is given under the skin. These injections are given by a health care professional. You must not be pregnant before getting an injection. The injection is usually given during the first 5 days after the start of a menstrual period or 6 weeks after delivery of a baby. Talk to your pediatrician regarding the use of this medicine in children. Special care may be needed. These injections have been used in female children who have started having menstrual periods. Overdosage: If you think you have taken too much of this medicine contact a poison control center or emergency room at once. NOTE: This medicine is only for you. Do not share this medicine with others. What if I miss a dose? Try not to miss a dose. You must get an injection once every 3 months to maintain birth control. If you cannot keep an appointment, call and reschedule it. If you wait longer than 13 weeks between Depo-Provera contraceptive injections or longer than 14 weeks between Depo-subQ Provera 104 injections, you could get pregnant. Use another method for birth control if you miss your appointment. You may also need a pregnancy test before receiving another injection. What may interact with this medicine? Do not take this medicine with any of the following medications: -bosentan This medicine may also interact with the following medications: -aminoglutethimide -antibiotics or medicines for infections, especially rifampin, rifabutin, rifapentine, and griseofulvin -aprepitant -barbiturate medicines such as phenobarbital or primidone -bexarotene -carbamazepine -medicines for seizures  like ethotoin, felbamate, oxcarbazepine, phenytoin,  topiramate -modafinil -St. John's wort This list may not describe all possible interactions. Give your health care provider a list of all the medicines, herbs, non-prescription drugs, or dietary supplements you use. Also tell them if you smoke, drink alcohol, or use illegal drugs. Some items may interact with your medicine. What should I watch for while using this medicine? This drug does not protect you against HIV infection (AIDS) or other sexually transmitted diseases. Use of this product may cause you to lose calcium from your bones. Loss of calcium may cause weak bones (osteoporosis). Only use this product for more than 2 years if other forms of birth control are not right for you. The longer you use this product for birth control the more likely you will be at risk for weak bones. Ask your health care professional how you can keep strong bones. You may have a change in bleeding pattern or irregular periods. Many females stop having periods while taking this drug. If you have received your injections on time, your chance of being pregnant is very low. If you think you may be pregnant, see your health care professional as soon as possible. Tell your health care professional if you want to get pregnant within the next year. The effect of this medicine may last a long time after you get your last injection. What side effects may I notice from receiving this medicine? Side effects that you should report to your doctor or health care professional as soon as possible: -allergic reactions like skin rash, itching or hives, swelling of the face, lips, or tongue -breast tenderness or discharge -breathing problems -changes in vision -depression -feeling faint or lightheaded, falls -fever -pain in the abdomen, chest, groin, or leg -problems with balance, talking, walking -unusually weak or tired -yellowing of the eyes or skin Side effects that usually do not require medical attention (report to your  doctor or health care professional if they continue or are bothersome): -acne -fluid retention and swelling -headache -irregular periods, spotting, or absent periods -temporary pain, itching, or skin reaction at site where injected -weight gain This list may not describe all possible side effects. Call your doctor for medical advice about side effects. You may report side effects to FDA at 1-800-FDA-1088. Where should I keep my medicine? This does not apply. The injection will be given to you by a health care professional. NOTE: This sheet is a summary. It may not cover all possible information. If you have questions about this medicine, talk to your doctor, pharmacist, or health care provider.  2018 Elsevier/Gold Standard (2008-09-22 18:37:56)

## 2017-12-18 NOTE — Progress Notes (Signed)
.  Depo Provera 150mg /1 ml is given in a single injection  in the Rt. gluteus maximus. Pt tolerated w/o any complication

## 2018-03-05 ENCOUNTER — Ambulatory Visit: Payer: Self-pay

## 2018-06-29 ENCOUNTER — Ambulatory Visit: Payer: BC Managed Care – PPO | Admitting: Nurse Practitioner

## 2019-01-06 ENCOUNTER — Other Ambulatory Visit: Payer: Self-pay | Admitting: Nurse Practitioner

## 2019-01-06 DIAGNOSIS — I10 Essential (primary) hypertension: Secondary | ICD-10-CM

## 2019-01-16 ENCOUNTER — Other Ambulatory Visit: Payer: Self-pay | Admitting: Nurse Practitioner

## 2019-01-16 DIAGNOSIS — I10 Essential (primary) hypertension: Secondary | ICD-10-CM

## 2019-01-18 ENCOUNTER — Ambulatory Visit (INDEPENDENT_AMBULATORY_CARE_PROVIDER_SITE_OTHER): Payer: BC Managed Care – PPO | Admitting: Family Medicine

## 2019-01-18 ENCOUNTER — Encounter: Payer: Self-pay | Admitting: Family Medicine

## 2019-01-18 ENCOUNTER — Other Ambulatory Visit: Payer: Self-pay

## 2019-01-18 DIAGNOSIS — I10 Essential (primary) hypertension: Secondary | ICD-10-CM

## 2019-01-18 MED ORDER — HYDROCHLOROTHIAZIDE 25 MG PO TABS: 25.0000 mg | ORAL_TABLET | Freq: Every day | ORAL | 3 refills | Status: DC

## 2019-01-18 NOTE — Progress Notes (Signed)
Virtual Visit via Telephone The purpose of this virtual visit is to provide medical care while limiting exposure to the novel coronavirus (COVID19) for both patient and office staff.  Consent was obtained for phone visit:  Yes.   Answered questions that patient had about telehealth interaction:  Yes.   I discussed the limitations, risks, security and privacy concerns of performing an evaluation and management service by telephone. I also discussed with the patient that there may be a patient responsible charge related to this service. The patient expressed understanding and agreed to proceed.  Patient Location: Home Provider Location: [facility] (Office)  PCP is Wilhelmina McardleLauren Kennedy, AGPCNP-BC - I am currently covering during her maternity leave.   ---------------------------------------------------------------------- Chief Complaint  Patient presents with  . Hypertension    S: Reviewed CMA documentation. I have called patient and gathered additional HPI as follows:  CHRONIC HTN: Reports doing well. Checks BP at home x 1 monthly avg 125s / 80s. Current Meds - HCTZ 25mg  daily - due for refill Reports good compliance, took meds today. Tolerating well, w/o complaints. Lifestyle: - Diet: balanced, tries low sodium Denies CP, dyspnea, HA, edema, dizziness / lightheadedness  Denies any high risk travel to areas of current concern for COVID19. Denies any known or suspected exposure to person with or possibly with COVID19.  Denies any fevers, chills, sweats, body ache, cough, shortness of breath, sinus pain or pressure, headache, abdominal pain, diarrhea  Past Medical History:  Diagnosis Date  . Anemia    Iron deficiency  . Heart murmur    as a child - resolved  . Hypertension    Social History   Tobacco Use  . Smoking status: Never Smoker  . Smokeless tobacco: Never Used  Substance Use Topics  . Alcohol use: Yes    Comment: occasionally  . Drug use: No   Comment: occasional marijuana 1-2 x per month    Current Outpatient Medications:  .  Blood Pressure Monitoring (B-D ASSURE BPM/AUTO ARM CUFF) MISC, 1 Units by Does not apply route 2 (two) times a week., Disp: 1 each, Rfl: 0 .  hydrochlorothiazide (HYDRODIURIL) 25 MG tablet, Take 1 tablet (25 mg total) by mouth daily., Disp: 90 tablet, Rfl: 3 .  medroxyPROGESTERone (DEPO-PROVERA) 150 MG/ML injection, , Disp: , Rfl:  .  Multiple Vitamin (MULTIVITAMIN) tablet, Take 1 tablet by mouth daily., Disp: , Rfl:  .  medroxyPROGESTERone (DEPO-PROVERA) 150 MG/ML injection, Inject 1 mL (150 mg total) into the muscle once for 1 dose., Disp: 1 mL, Rfl: 3  [facility] screen [facility] 01/18/2019 01/26/2017  Decreased Interest 0 0  Down, Depressed, Hopeless 0 1  PHQ - 2 Score 0 1    No flowsheet data found.  -------------------------------------------------------------------------- O: No physical exam performed due to remote telephone encounter.  Lab results reviewed.  CMP Latest Ref Rng & Units 03/02/2017 09/07/2016 09/06/2016  Glucose 65 - 99 mg/dL 87 85 95  BUN 7 - 25 mg/dL 11 8 12   Creatinine 0.50 - 1.10 mg/dL 1.610.77 0.960.75 0.450.91  Sodium 135 - 146 mmol/L 142 139 139  Potassium 3.5 - 5.3 mmol/L 3.7 3.5 3.6  Chloride 98 - 110 mmol/L 105 104 105  CO2 20 - 31 mmol/L 22 26 26   Calcium 8.6 - 10.2 mg/dL 9.3 9.8 9.1  Total Protein 6.1 - 8.1 g/dL 7.0 7.9 7.0  Total Bilirubin 0.2 - 1.2 mg/dL 0.4 1.2 0.5  Alkaline Phos 33 - 115 U/L 46 114 100  AST 10 -  30 U/L 17 30 35  ALT 6 - 29 U/L 15 33 36     No results found for this or any previous visit (from the past 2160 hour(s)).  -------------------------------------------------------------------------- A&P:  Problem List Items Addressed This Visit    Essential hypertension - Primary    Reportedly controlled HTN - Home BP readings reviewed  No known complications     Plan:  1. Continue current BP regimen - refilled HCTZ 25mg  daily 2. Encourage improved  lifestyle - low sodium diet, regular exercise 3. Continue monitor BP outside office, bring readings to next visit, if persistently >140/90 or new symptoms notify office sooner 4. Follow-up 6-12 months, annual phys + labs w/ PCP       Relevant Medications   hydrochlorothiazide (HYDRODIURIL) 25 MG tablet      Meds ordered this encounter  Medications  . hydrochlorothiazide (HYDRODIURIL) 25 MG tablet    Sig: Take 1 tablet (25 mg total) by mouth daily.    Dispense:  90 tablet    Refill:  3    Follow-up: - Return in 6 months for Annual Physical+ labs w/ PCP when ready to schedule, declined now, requested when coronavirus pandemic was over  Patient verbalizes understanding with the above medical recommendations including the limitation of remote medical advice.  Specific follow-up and call-back criteria were given for patient to follow-up or seek medical care more urgently if needed.   - Time spent in direct consultation with patient on phone: 6 minutes   Saralyn Pilar, DO [facility] 01/18/2019, 8:14 AM

## 2019-01-18 NOTE — Assessment & Plan Note (Addendum)
Reportedly controlled HTN - Home BP readings reviewed  No known complications     Plan:  1. Continue current BP regimen - refilled HCTZ 25mg  daily 2. Encourage improved lifestyle - low sodium diet, regular exercise 3. Continue monitor BP outside office, bring readings to next visit, if persistently >140/90 or new symptoms notify office sooner 4. Follow-up 6-12 months, annual phys + labs w/ PCP

## 2019-01-18 NOTE — Patient Instructions (Addendum)
AVS given by phone. No MyChart access. 

## 2019-05-24 ENCOUNTER — Other Ambulatory Visit: Payer: Self-pay

## 2019-05-24 ENCOUNTER — Encounter: Payer: Self-pay | Admitting: Nurse Practitioner

## 2019-05-24 ENCOUNTER — Ambulatory Visit: Payer: BC Managed Care – PPO | Admitting: Nurse Practitioner

## 2019-05-24 DIAGNOSIS — I10 Essential (primary) hypertension: Secondary | ICD-10-CM

## 2019-05-24 MED ORDER — HYDROCHLOROTHIAZIDE 25 MG PO TABS: 25.0000 mg | ORAL_TABLET | Freq: Every day | ORAL | 3 refills | Status: DC

## 2019-05-24 NOTE — Progress Notes (Signed)
Subjective:    Patient ID: Jasmine Pruitt, female    DOB: 1976/06/26, 43 y.o.   MRN: [id number]  Jasmine Pruitt is a 43 y.o. female presenting on 05/24/2019 for Hypertension  HPI Hypertension - She is checking BP at home or outside of clinic.  Checks at stores once every 2-3 mos.  No machine there currently. - Current medications: hydrochlorothiazide 25 mg once daily, tolerating well without side effects - She is not currently symptomatic. - Pt denies headache, lightheadedness, dizziness, changes in vision, chest tightness/pressure, palpitations, leg swelling, sudden loss of speech or loss of consciousness. - She  reports an exercise routine that includes walking, 2 days per week. - Her diet is moderate in salt, moderate in fat, and moderate in carbohydrates.   Social History   Tobacco Use  . Smoking status: Never Smoker  . Smokeless tobacco: Never Used  Substance Use Topics  . Alcohol use: Yes    Comment: occasionally  . Drug use: No    Comment: occasional marijuana 1-2 x per month    Review of Systems Per HPI unless specifically indicated above     Objective:    BP 124/86 (BP Location: Left Arm, Patient Position: Sitting, Cuff Size: Normal)   Pulse 65   Ht 5\' 9"  (1.753 m)   Wt 141 lb 3.2 oz (64 kg)   BMI 20.85 kg/m   Wt Readings from Last 3 Encounters:  05/24/19 141 lb 3.2 oz (64 kg)  12/18/17 147 lb 9.6 oz (67 kg)  10/12/17 147 lb 3.2 oz (66.8 kg)    Physical Exam Vitals signs reviewed.  Constitutional:      General: She is not in acute distress.    Appearance: She is well-developed.  HENT:     Head: Normocephalic and atraumatic.  Cardiovascular:     Rate and Rhythm: Normal rate and regular rhythm.     Pulses:          Radial pulses are 2+ on the right side and 2+ on the left side.       Posterior tibial pulses are 1+ on the right side and 1+ on the left side.     Heart sounds: Normal heart sounds, S1 normal and S2 normal.  Pulmonary:     Effort: Pulmonary  effort is normal. No respiratory distress.     Breath sounds: Normal breath sounds and air entry.  Musculoskeletal:     Right lower leg: No edema.     Left lower leg: No edema.  Lymphadenopathy:     Cervical: No cervical adenopathy.     Right cervical: No superficial, deep or posterior cervical adenopathy.    Left cervical: No superficial, deep or posterior cervical adenopathy.  Skin:    General: Skin is warm and dry.     Capillary Refill: Capillary refill takes less than 2 seconds.  Neurological:     General: No focal deficit present.     Mental Status: She is alert and oriented to person, place, and time. Mental status is at baseline.  Psychiatric:        Attention and Perception: Attention normal.        Mood and Affect: Mood and affect normal.        Behavior: Behavior normal. Behavior is cooperative.        Thought Content: Thought content normal.        Judgment: Judgment normal.    Results for orders placed or performed in visit on 12/18/17  POCT  urine pregnancy  Result Value Ref Range   Preg Test, Ur Negative Negative      Assessment & Plan:   Problem List Items Addressed This Visit      Cardiovascular and Mediastinum   Essential hypertension   Relevant Medications   hydrochlorothiazide (HYDRODIURIL) 25 MG tablet   Other Relevant Orders   COMPLETE METABOLIC PANEL WITH GFR    Controlled hypertension.  BP goal < 130/80.  Pt is working on lifestyle modifications.  Taking medications tolerating well without side effects. No currently identified complications.  Plan: 1. Continue taking hydrochlorothiazide 25 mg once daily 2. Obtain labs today  3. Encouraged heart healthy diet and increasing exercise to 30 minutes most days of the week. 4. Check BP 1-2 x per week at home, keep log, and bring to clinic at next appointment. 5. Follow up 9 months to get on schedule of every 6 months alternating physical and hypertension visits   Meds ordered this encounter   Medications  . hydrochlorothiazide (HYDRODIURIL) 25 MG tablet    Sig: Take 1 tablet (25 mg total) by mouth daily.    Dispense:  90 tablet    Refill:  3    Order Specific Question:   Supervising Provider    Answer:   Smitty CordsKARAMALEGOS, ALEXANDER J [[id number]]    Follow up plan: Return in about 3 months (around 08/23/2019) for annual physical AND 9 months for blood pressure.  Wilhelmina McardleLauren Dayne Dekay, DNP, AGPCNP-BC Adult Gerontology Primary Care Nurse Practitioner [facility] 05/24/2019, 8:42 AM

## 2019-05-24 NOTE — Patient Instructions (Addendum)
[id number] Noviello,   Thank you for coming in to clinic today.  1. Continue hydrochlorothiazide 25 mg  Please schedule a follow-up appointment with Cassell Smiles, AGNP. Return in about 3 months (around 08/23/2019) for annual physical AND 9 months for blood pressure.  If you have any other questions or concerns, please feel free to call the clinic or send a message through [place]. You may also schedule an earlier appointment if necessary.  You will receive a survey after today's visit either digitally by e-mail or paper by [employer]. Your experiences and feedback matter to [place].  Please respond so we know how we are doing as we provide care for you.   Cassell Smiles, DNP, AGNP-BC Adult Gerontology Nurse Practitioner [place]

## 2019-05-25 LAB — COMPLETE METABOLIC PANEL WITH GFR
AG Ratio: 1.6 (calc) (ref 1.0–2.5)
ALT: 10 U/L (ref 6–29)
AST: 16 U/L (ref 10–30)
Albumin: 4.4 g/dL (ref 3.6–5.1)
Alkaline phosphatase (APISO): 40 U/L (ref 31–125)
BUN: 12 mg/dL (ref 7–25)
CO2: 31 mmol/L (ref 20–32)
Calcium: 10.2 mg/dL (ref 8.6–10.2)
Chloride: 99 mmol/L (ref 98–110)
Creat: 0.9 mg/dL (ref 0.50–1.10)
GFR, Est African American: 91 mL/min/{1.73_m2} (ref >=60)
GFR, Est Non African American: 79 mL/min/{1.73_m2} (ref >=60)
Globulin: 2.8 g/dL (calc) (ref 1.9–3.7)
Glucose, Bld: 61 mg/dL — ABNORMAL LOW (ref 65–99)
Potassium: 4 mmol/L (ref 3.5–5.3)
Sodium: 140 mmol/L (ref 135–146)
Total Bilirubin: 0.6 mg/dL (ref 0.2–1.2)
Total Protein: 7.2 g/dL (ref 6.1–8.1)

## 2019-09-22 ENCOUNTER — Encounter: Payer: BC Managed Care – PPO | Admitting: Physician Assistant

## 2019-09-29 ENCOUNTER — Ambulatory Visit (INDEPENDENT_AMBULATORY_CARE_PROVIDER_SITE_OTHER): Payer: BC Managed Care – PPO | Admitting: Physician Assistant

## 2019-09-29 ENCOUNTER — Other Ambulatory Visit (HOSPITAL_COMMUNITY)
Admission: RE | Admit: 2019-09-29 | Discharge: 2019-09-29 | Disposition: A | Discharge status: Home / Self Care | Payer: BC Managed Care – PPO | Source: Ambulatory Visit | Attending: Physician Assistant | Admitting: Physician Assistant

## 2019-09-29 ENCOUNTER — Encounter: Payer: Self-pay | Admitting: Physician Assistant

## 2019-09-29 ENCOUNTER — Other Ambulatory Visit: Payer: Self-pay

## 2019-09-29 VITALS — BP 106/67 | HR 70 | Ht 69.0 in | Wt 152.4 lb

## 2019-09-29 DIAGNOSIS — I10 Essential (primary) hypertension: Secondary | ICD-10-CM | POA: Diagnosis not present

## 2019-09-29 DIAGNOSIS — N898 Other specified noninflammatory disorders of vagina: Secondary | ICD-10-CM | POA: Diagnosis present

## 2019-09-29 DIAGNOSIS — Z124 Encounter for screening for malignant neoplasm of cervix: Secondary | ICD-10-CM | POA: Insufficient documentation

## 2019-09-29 DIAGNOSIS — Z0000 Encounter for general adult medical examination without abnormal findings: Secondary | ICD-10-CM | POA: Diagnosis not present

## 2019-09-29 DIAGNOSIS — N760 Acute vaginitis: Secondary | ICD-10-CM

## 2019-09-29 DIAGNOSIS — B9689 Other specified bacterial agents as the cause of diseases classified elsewhere: Secondary | ICD-10-CM | POA: Diagnosis not present

## 2019-09-29 MED ORDER — FLUCONAZOLE 150 MG PO TABS: ORAL_TABLET | ORAL | 0 refills | Status: DC

## 2019-09-29 MED ORDER — HYDROCHLOROTHIAZIDE 25 MG PO TABS: 25.0000 mg | ORAL_TABLET | Freq: Every day | ORAL | 0 refills | Status: DC

## 2019-09-29 NOTE — Progress Notes (Signed)
Subjective:    Patient ID: Jasmine Pruitt, female    DOB: 05/06/1976, 44 y.o.   MRN: [id number]  Jasmine Pruitt is a 44 y.o. female presenting on 09/29/2019 for Annual Exam   HPI   HTN: Currently takes HCTZ 25 mg daily.   Due for PAP smear. No family history of colon cancer or breast cancer.   Past Medical History:  Diagnosis Date  . Anemia    Iron deficiency  . Heart murmur    as a child - resolved  . Hypertension    Past Surgical History:  Procedure Laterality Date  . WISDOM TOOTH EXTRACTION     Social History   Socioeconomic History  . Marital status: Single    Spouse name: Not on file  . Number of children: Not on file  . Years of education: Not on file  . Highest education level: Not on file  Occupational History  . Not on file  Tobacco Use  . Smoking status: Never Smoker  . Smokeless tobacco: Never Used  Substance and Sexual Activity  . Alcohol use: Not Currently    Comment: occasionally  . Drug use: No    Comment: occasional marijuana 1-2 x per month  . Sexual activity: Yes    Birth control/protection: Injection    Comment: Depo - on and off since 44 years old  Other Topics Concern  . Not on file  Social History Narrative  . Not on file   Social Determinants of Health   Financial Resource Strain:   . Difficulty of Paying Living Expenses: Not on file  Food Insecurity:   . Worried About Charity fundraiser in the Last Year: Not on file  . Ran Out of Food in the Last Year: Not on file  Transportation Needs:   . Lack of Transportation (Medical): Not on file  . Lack of Transportation (Non-Medical): Not on file  Physical Activity:   . Days of Exercise per Week: Not on file  . Minutes of Exercise per Session: Not on file  Stress:   . Feeling of Stress : Not on file  Social Connections:   . Frequency of Communication with Friends and Family: Not on file  . Frequency of Social Gatherings with Friends and Family: Not on file  . Attends Religious  Services: Not on file  . Active Member of Clubs or Organizations: Not on file  . Attends Archivist Meetings: Not on file  . Marital Status: Not on file  Intimate Partner Violence:   . Fear of Current or Ex-Partner: Not on file  . Emotionally Abused: Not on file  . Physically Abused: Not on file  . Sexually Abused: Not on file   Family History  Problem Relation Age of Onset  . Diabetes Mother   . Schizophrenia Mother   . Hypertension Father   . Heart attack Father   . Healthy Sister   . Healthy Brother   . Breast cancer Maternal Aunt   . Healthy Sister   . Healthy Brother   . Stroke Neg Hx   . Colon cancer Neg Hx   . Ovarian cancer Neg Hx    Current Outpatient Medications on File Prior to Visit  Medication Sig  . Blood Pressure Monitoring (B-D ASSURE BPM/AUTO ARM CUFF) MISC 1 Units by Does not apply route 2 (two) times a week.  . Multiple Vitamin (MULTIVITAMIN) tablet Take 1 tablet by mouth daily.   No current facility-administered medications on file prior  to visit.    Review of Systems Per HPI unless specifically indicated above  *    Objective:    BP 106/67 (BP Location: Right Arm, Patient Position: Sitting, Cuff Size: Normal)   Pulse 70   Ht 5\' 9"  (1.753 m)   Wt 152 lb 6.4 oz (69.1 kg)   BMI 22.51 kg/m   Wt Readings from Last 3 Encounters:  09/29/19 152 lb 6.4 oz (69.1 kg)  05/24/19 141 lb 3.2 oz (64 kg)  12/18/17 147 lb 9.6 oz (67 kg)    Physical Exam Constitutional:      Appearance: Normal appearance.  Cardiovascular:     Rate and Rhythm: Normal rate and regular rhythm.     Heart sounds: Normal heart sounds.  Pulmonary:     Effort: Pulmonary effort is normal.     Breath sounds: Normal breath sounds.  Abdominal:     General: Bowel sounds are normal.     Palpations: Abdomen is soft.  Genitourinary:    Vagina: Vaginal discharge present. No bleeding.     Cervix: Normal.  Skin:    General: Skin is warm and dry.  Neurological:     Mental  Status: She is alert and oriented to person, place, and time. Mental status is at baseline.  Psychiatric:        Mood and Affect: Mood normal.        Behavior: Behavior normal.    Results for orders placed or performed in visit on 05/24/19  COMPLETE METABOLIC PANEL WITH GFR  Result Value Ref Range   Glucose, Bld 61 (L) 65 - 99 mg/dL   BUN 12 7 - 25 mg/dL   Creat 07/24/19 4.26 - 8.34 mg/dL   GFR, Est Non African American 79 > OR = 60 mL/min/1.80m2   GFR, Est African American 91 > OR = 60 mL/min/1.48m2   BUN/Creatinine Ratio NOT APPLICABLE 6 - 22 (calc)   Sodium 140 135 - 146 mmol/L   Potassium 4.0 3.5 - 5.3 mmol/L   Chloride 99 98 - 110 mmol/L   CO2 31 20 - 32 mmol/L   Calcium 10.2 8.6 - 10.2 mg/dL   Total Protein 7.2 6.1 - 8.1 g/dL   Albumin 4.4 3.6 - 5.1 g/dL   Globulin 2.8 1.9 - 3.7 g/dL (calc)   AG Ratio 1.6 1.0 - 2.5 (calc)   Total Bilirubin 0.6 0.2 - 1.2 mg/dL   Alkaline phosphatase (APISO) 40 31 - 125 U/L   AST 16 10 - 30 U/L   ALT 10 6 - 29 U/L      Assessment & Plan:  1. Annual physical exam   2. Cervical cancer screening  - Cytology - PAP  3. Vaginal discharge  Patient initially treated empirically with diflucan but swab shows BV and flagyl was sent in.  - Cervicovaginal ancillary only - fluconazole (DIFLUCAN) 150 MG tablet; Take 1 tablet on day 1. Take a second tablet three days later if symptoms persist.  Dispense: 2 tablet; Refill: 0  4. Essential hypertension  - hydrochlorothiazide (HYDRODIURIL) 25 MG tablet; Take 1 tablet (25 mg total) by mouth daily.  Dispense: 90 tablet; Refill: 0 - Comprehensive Metabolic Panel (CMET)  5. Bacterial vaginitis  - metroNIDAZOLE (FLAGYL) 500 MG tablet; Take 1 tablet (500 mg total) by mouth 2 (two) times daily for 7 days.  Dispense: 14 tablet; Refill: 0   Follow up plan: No follow-ups on file.  75m, PA-C [facility]  09/29/2019, 3:06 PM

## 2019-10-01 LAB — COMPREHENSIVE METABOLIC PANEL
AG Ratio: 1.7 (calc) (ref 1.0–2.5)
ALT: 10 U/L (ref 6–29)
AST: 21 U/L (ref 10–30)
Albumin: 4.2 g/dL (ref 3.6–5.1)
Alkaline phosphatase (APISO): 31 U/L (ref 31–125)
BUN: 12 mg/dL (ref 7–25)
CO2: 32 mmol/L (ref 20–32)
Calcium: 10 mg/dL (ref 8.6–10.2)
Chloride: 99 mmol/L (ref 98–110)
Creat: 0.93 mg/dL (ref 0.50–1.10)
Globulin: 2.5 g/dL (calc) (ref 1.9–3.7)
Glucose, Bld: 81 mg/dL (ref 65–139)
Potassium: 4 mmol/L (ref 3.5–5.3)
Sodium: 139 mmol/L (ref 135–146)
Total Bilirubin: 0.5 mg/dL (ref 0.2–1.2)
Total Protein: 6.7 g/dL (ref 6.1–8.1)

## 2019-10-03 LAB — CERVICOVAGINAL ANCILLARY ONLY
Bacterial Vaginitis (gardnerella): POSITIVE — AB
Candida Glabrata: NEGATIVE
Candida Vaginitis: NEGATIVE
Chlamydia: NEGATIVE
Comment: NEGATIVE
Comment: NEGATIVE
Comment: NEGATIVE
Comment: NEGATIVE
Comment: NEGATIVE
Comment: NORMAL
Neisseria Gonorrhea: NEGATIVE
Trichomonas: NEGATIVE

## 2019-10-04 ENCOUNTER — Telehealth: Payer: Self-pay

## 2019-10-04 LAB — CYTOLOGY - PAP
Adequacy: ABSENT
Comment: NEGATIVE
Diagnosis: NEGATIVE
High risk HPV: NEGATIVE

## 2019-10-04 MED ORDER — METRONIDAZOLE 500 MG PO TABS: 500.0000 mg | ORAL_TABLET | Freq: Two times a day (BID) | ORAL | 0 refills | Status: AC

## 2019-10-04 NOTE — Telephone Encounter (Signed)
The pt would prefer to go with the Flagyl oral medication.

## 2019-10-04 NOTE — Telephone Encounter (Signed)
-----   Message from Trey Sailors, [place] sent at 10/04/2019  8:40 AM EST ----- Vaginal swab was positive or BV and not yeast. This is treated by flagyl in pill or vaginal cream form. Which would she like?

## 2020-06-03 ENCOUNTER — Other Ambulatory Visit: Payer: Self-pay | Admitting: Nurse Practitioner

## 2020-06-03 DIAGNOSIS — I10 Essential (primary) hypertension: Secondary | ICD-10-CM

## 2020-06-03 NOTE — Telephone Encounter (Signed)
.   Requested Prescriptions  Pending Prescriptions Disp Refills  . hydrochlorothiazide (HYDRODIURIL) 25 MG tablet [Pharmacy Med Name: HYDROCHLOROTHIAZIDE 25MG  TABLETS] 30 tablet 0    Sig: TAKE 1 TABLET(25 MG) BY MOUTH DAILY     Cardiovascular: Diuretics - Thiazide Failed - 06/03/2020  8:01 AM      Failed - Valid encounter within last 6 months    Recent Outpatient Visits          8 months ago Annual physical exam   [facility], Trey Sailors   1 year ago Essential hypertension   [facility], Kyung Rudd, NP   1 year ago Essential hypertension   [facility], [place], DO   2 years ago Encounter for female birth control   [facility], Kyung Rudd, NP   2 years ago Acute nasopharyngitis   [facility], Kyung Rudd, NP             Passed - Ca in normal range and within 360 days    Calcium  Date Value Ref Range Status  09/30/2019 10.0 8.6 - 10.2 mg/dL Final         Passed - Cr in normal range and within 360 days    Creat  Date Value Ref Range Status  09/30/2019 0.93 0.50 - 1.10 mg/dL Final   Creatinine, Urine  Date Value Ref Range Status  08/28/2016 25 mg/dL Final         Passed - K in normal range and within 360 days    Potassium  Date Value Ref Range Status  09/30/2019 4.0 3.5 - 5.3 mmol/L Final         Passed - Na in normal range and within 360 days    Sodium  Date Value Ref Range Status  09/30/2019 139 135 - 146 mmol/L Final         Passed - Last BP in normal range    BP Readings from Last 1 Encounters:  09/29/19 106/67

## 2020-07-17 ENCOUNTER — Other Ambulatory Visit: Payer: Self-pay | Admitting: Physician Assistant

## 2020-07-17 DIAGNOSIS — I10 Essential (primary) hypertension: Secondary | ICD-10-CM

## 2020-07-25 ENCOUNTER — Other Ambulatory Visit: Payer: Self-pay | Admitting: Physician Assistant

## 2020-07-25 DIAGNOSIS — I10 Essential (primary) hypertension: Secondary | ICD-10-CM

## 2020-08-07 ENCOUNTER — Ambulatory Visit: Payer: Self-pay | Admitting: Physician Assistant

## 2020-08-14 ENCOUNTER — Ambulatory Visit: Payer: Self-pay | Admitting: Family Medicine

## 2020-11-22 ENCOUNTER — Other Ambulatory Visit: Payer: Self-pay

## 2020-11-22 ENCOUNTER — Ambulatory Visit (INDEPENDENT_AMBULATORY_CARE_PROVIDER_SITE_OTHER): Payer: Medicaid Other | Admitting: Unknown Physician Specialty

## 2020-11-22 ENCOUNTER — Encounter: Payer: Self-pay | Admitting: Unknown Physician Specialty

## 2020-11-22 VITALS — BP 116/63 | HR 61 | Temp 97.8°F | Resp 18 | Ht 69.0 in | Wt 154.6 lb

## 2020-11-22 DIAGNOSIS — I10 Essential (primary) hypertension: Secondary | ICD-10-CM

## 2020-11-22 DIAGNOSIS — N898 Other specified noninflammatory disorders of vagina: Secondary | ICD-10-CM

## 2020-11-22 DIAGNOSIS — Z0000 Encounter for general adult medical examination without abnormal findings: Secondary | ICD-10-CM

## 2020-11-22 MED ORDER — HYDROCHLOROTHIAZIDE 25 MG PO TABS
ORAL_TABLET | ORAL | 1 refills | Status: DC
Start: 2020-11-22 — End: 2021-08-28

## 2020-11-22 MED ORDER — METRONIDAZOLE 0.75 % VA GEL: 1.0000 | Freq: Two times a day (BID) | VAGINAL | 0 refills | Status: DC

## 2020-11-22 NOTE — Assessment & Plan Note (Signed)
Stable, continue present medications.   

## 2020-11-22 NOTE — Progress Notes (Signed)
BP 116/63 (BP Location: Right Arm, Patient Position: Sitting, Cuff Size: Normal)   Pulse 61   Temp 97.8 F (36.6 C) (Temporal)   Resp 18   Ht 5\' 9"  (1.753 m)   Wt 154 lb 9.6 oz (70.1 kg)   LMP 11/16/2020   SpO2 100%   BMI 22.83 kg/m    Subjective:    Patient ID: 01/16/2021, female    DOB: 05/05/1976, 45 y.o.   MRN: [id number]  HPI: Jasmine Pruitt is a 45 y.o. female  Chief Complaint  Patient presents with  . Annual Exam   Hypertension Using medications without difficulty Average home BPs Not checking  No problems or lightheadedness No chest pain with exertion or shortness of breath No Edema   Social History   Socioeconomic History  . Marital status: Single    Spouse name: Not on file  . Number of children: Not on file  . Years of education: Not on file  . Highest education level: Not on file  Occupational History  . Not on file  Tobacco Use  . Smoking status: Never Smoker  . Smokeless tobacco: Never Used  Vaping Use  . Vaping Use: Never used  Substance and Sexual Activity  . Alcohol use: Not Currently    Comment: occasionally  . Drug use: No    Comment: occasional marijuana 1-2 x per month  . Sexual activity: Yes    Birth control/protection: Injection    Comment: Depo - on and off since 45 years old  Other Topics Concern  . Not on file  Social History Narrative  . Not on file   Social Determinants of Health   Financial Resource Strain: Not on file  Food Insecurity: Not on file  Transportation Needs: Not on file  Physical Activity: Not on file  Stress: Not on file  Social Connections: Not on file  Intimate Partner Violence: Not on file   Family History  Problem Relation Age of Onset  . Diabetes Mother   . Schizophrenia Mother   . Hypertension Father   . Heart attack Father   . Healthy Sister   . Healthy Brother   . Breast cancer Maternal Aunt   . Healthy Sister   . Healthy Brother   . Stroke Neg Hx   . Colon cancer Neg Hx   . Ovarian  cancer Neg Hx    Past Medical History:  Diagnosis Date  . Anemia    Iron deficiency  . Heart murmur    as a child - resolved  . Hypertension    Past Surgical History:  Procedure Laterality Date  . WISDOM TOOTH EXTRACTION       Relevant past medical, surgical, family and social history reviewed and updated as indicated. Interim medical history since our last visit reviewed. Allergies and medications reviewed and updated.  Review of Systems  Genitourinary: Positive for vaginal discharge.  All other systems reviewed and are negative.   Per HPI unless specifically indicated above     Objective:    BP 116/63 (BP Location: Right Arm, Patient Position: Sitting, Cuff Size: Normal)   Pulse 61   Temp 97.8 F (36.6 C) (Temporal)   Resp 18   Ht 5\' 9"  (1.753 m)   Wt 154 lb 9.6 oz (70.1 kg)   LMP 11/16/2020   SpO2 100%   BMI 22.83 kg/m   Wt Readings from Last 3 Encounters:  11/22/20 154 lb 9.6 oz (70.1 kg)  09/29/19 152 lb 6.4 oz (  69.1 kg)  05/24/19 141 lb 3.2 oz (64 kg)    Physical Exam Constitutional:      Appearance: She is well-developed.  HENT:     Head: Normocephalic and atraumatic.  Eyes:     General: No scleral icterus.       Right eye: No discharge.        Left eye: No discharge.     Pupils: Pupils are equal, round, and reactive to light.  Neck:     Thyroid: No thyromegaly.     Vascular: No carotid bruit.  Cardiovascular:     Rate and Rhythm: Normal rate and regular rhythm.     Heart sounds: Normal heart sounds. No murmur heard. No friction rub. No gallop.   Pulmonary:     Effort: Pulmonary effort is normal. No respiratory distress.     Breath sounds: Normal breath sounds. No wheezing or rales.  Abdominal:     General: Bowel sounds are normal.     Palpations: Abdomen is soft.     Tenderness: There is no abdominal tenderness. There is no rebound.  Genitourinary:    Comments: Copious discharge.  No odor Musculoskeletal:        General: Normal range of  motion.     Cervical back: Normal range of motion and neck supple.  Lymphadenopathy:     Cervical: No cervical adenopathy.  Skin:    General: Skin is warm and dry.     Findings: No rash.  Neurological:     Mental Status: She is alert and oriented to person, place, and time.  Psychiatric:        Speech: Speech normal.        Behavior: Behavior normal.        Thought Content: Thought content normal.        Judgment: Judgment normal.     Results for orders placed or performed in visit on 09/29/19  Comprehensive Metabolic Panel (CMET)  Result Value Ref Range   Glucose, Bld 81 65 - 139 mg/dL   BUN 12 7 - 25 mg/dL   Creat 5.64 3.32 - 9.51 mg/dL   BUN/Creatinine Ratio NOT APPLICABLE 6 - 22 (calc)   Sodium 139 135 - 146 mmol/L   Potassium 4.0 3.5 - 5.3 mmol/L   Chloride 99 98 - 110 mmol/L   CO2 32 20 - 32 mmol/L   Calcium 10.0 8.6 - 10.2 mg/dL   Total Protein 6.7 6.1 - 8.1 g/dL   Albumin 4.2 3.6 - 5.1 g/dL   Globulin 2.5 1.9 - 3.7 g/dL (calc)   AG Ratio 1.7 1.0 - 2.5 (calc)   Total Bilirubin 0.5 0.2 - 1.2 mg/dL   Alkaline phosphatase (APISO) 31 31 - 125 U/L   AST 21 10 - 30 U/L   ALT 10 6 - 29 U/L  Cervicovaginal ancillary only  Result Value Ref Range   Neisseria Gonorrhea Negative    Chlamydia Negative    Trichomonas Negative    Bacterial Vaginitis (gardnerella) Positive (A)    Candida Vaginitis Negative    Candida Glabrata Negative    Comment      Normal Reference Range Bacterial Vaginosis - Negative   Comment Normal Reference Range Candida Species - Negative    Comment Normal Reference Range Candida Galbrata - Negative    Comment Normal Reference Range Trichomonas - Negative    Comment Normal Reference Ranger Chlamydia - Negative    Comment      Normal Reference Range Neisseria Gonorrhea -  Negative  Cytology - PAP  Result Value Ref Range   High risk HPV Negative    Adequacy      Satisfactory for evaluation; transformation zone component ABSENT.   Diagnosis      -  Negative for intraepithelial lesion or malignancy (NILM)   Microorganisms Shift in flora suggestive of bacterial vaginosis    Comment Normal Reference Range HPV - Negative       Assessment & Plan:   Problem List Items Addressed This Visit      Unprioritized   Essential hypertension    Stable, continue present medications.        Relevant Medications   hydrochlorothiazide (HYDRODIURIL) 25 MG tablet   Other Relevant Orders   CBC with Differential/Platelet   Comprehensive metabolic panel   Lipid panel   TSH    Other Visit Diagnoses    Routine general medical examination at a health care facility    -  Primary   Relevant Orders   Hepatitis C antibody   Vaginal discharge       Presumed BV.  Pt not wanting STD check.  Will rx metronidazole cream.  RTC if no improvement      Declining: Influenza vaccine Covid vaccine TD vaccine   Follow up plan: Return in about 6 months (around 05/25/2021).

## 2020-11-23 LAB — CBC WITH DIFFERENTIAL/PLATELET
Absolute Monocytes: 250 cells/uL (ref 200–950)
Basophils Absolute: 31 cells/uL (ref 0–200)
Basophils Relative: 0.4 %
Eosinophils Absolute: 8 cells/uL — ABNORMAL LOW (ref 15–500)
Eosinophils Relative: 0.1 %
HCT: 45.8 % — ABNORMAL HIGH (ref 35.0–45.0)
Hemoglobin: 14.9 g/dL (ref 11.7–15.5)
Lymphs Abs: 1396 cells/uL (ref 850–3900)
MCH: 28.5 pg (ref 27.0–33.0)
MCHC: 32.5 g/dL (ref 32.0–36.0)
MCV: 87.6 fL (ref 80.0–100.0)
MPV: 11.4 fL (ref 7.5–12.5)
Monocytes Relative: 3.2 %
Neutro Abs: 6115 cells/uL (ref 1500–7800)
Neutrophils Relative %: 78.4 %
Platelets: 395 10*3/uL (ref 140–400)
RBC: 5.23 10*6/uL — ABNORMAL HIGH (ref 3.80–5.10)
RDW: 12.2 % (ref 11.0–15.0)
Total Lymphocyte: 17.9 %
WBC: 7.8 10*3/uL (ref 3.8–10.8)

## 2020-11-23 LAB — COMPREHENSIVE METABOLIC PANEL
AG Ratio: 1.8 (calc) (ref 1.0–2.5)
ALT: 9 U/L (ref 6–29)
AST: 15 U/L (ref 10–30)
Albumin: 4.4 g/dL (ref 3.6–5.1)
Alkaline phosphatase (APISO): 34 U/L (ref 31–125)
BUN: 16 mg/dL (ref 7–25)
CO2: 30 mmol/L (ref 20–32)
Calcium: 9.6 mg/dL (ref 8.6–10.2)
Chloride: 104 mmol/L (ref 98–110)
Creat: 0.88 mg/dL (ref 0.50–1.10)
Globulin: 2.5 g/dL (calc) (ref 1.9–3.7)
Glucose, Bld: 91 mg/dL (ref 65–99)
Potassium: 3.9 mmol/L (ref 3.5–5.3)
Sodium: 140 mmol/L (ref 135–146)
Total Bilirubin: 0.7 mg/dL (ref 0.2–1.2)
Total Protein: 6.9 g/dL (ref 6.1–8.1)

## 2020-11-23 LAB — HEPATITIS C ANTIBODY
Hepatitis C Ab: NONREACTIVE
SIGNAL TO CUT-OFF: 0.02 (ref <1.00)

## 2020-11-23 LAB — LIPID PANEL
Cholesterol: 187 mg/dL (ref <200)
HDL: 70 mg/dL (ref >=50)
LDL Cholesterol (Calc): 102 mg/dL (calc) — ABNORMAL HIGH
Non-HDL Cholesterol (Calc): 117 mg/dL (calc) (ref <130)
Total CHOL/HDL Ratio: 2.7 (calc) (ref <5.0)
Triglycerides: 64 mg/dL (ref <150)

## 2020-11-23 LAB — TSH: TSH: 0.35 mIU/L — ABNORMAL LOW

## 2020-12-04 NOTE — Addendum Note (Signed)
Addended by: Gabriel Cirri on: 12/04/2020 01:17 PM   Modules accepted: Level of Service

## 2021-05-27 ENCOUNTER — Ambulatory Visit: Payer: Medicaid Other | Admitting: Internal Medicine

## 2021-08-28 ENCOUNTER — Other Ambulatory Visit: Payer: Self-pay | Admitting: Unknown Physician Specialty

## 2021-08-28 DIAGNOSIS — I10 Essential (primary) hypertension: Secondary | ICD-10-CM

## 2021-08-28 NOTE — Telephone Encounter (Signed)
Requested medications are due for refill today.  yes  Requested medications are on the active medications list.  yes  Last refill. 11/22/2020  Future visit scheduled.   no  Notes to clinic.  Last seen in clinic 11/22/2020. PCP listed is Wilhelmina Mcardle.    Requested Prescriptions  Pending Prescriptions Disp Refills   hydrochlorothiazide (HYDRODIURIL) 25 MG tablet [Pharmacy Med Name: HYDROCHLOROTHIAZIDE 25MG  TABLETS] 90 tablet 1    Sig: TAKE 1 TABLET(25 MG) BY MOUTH DAILY     Cardiovascular: Diuretics - Thiazide Failed - 08/28/2021  3:16 AM      Failed - Valid encounter within last 6 months    Recent Outpatient Visits           9 months ago Routine general medical examination at a health care facility   [facility], [place], NP   1 year ago Annual physical exam   [facility] [place], [place] M, M   2 years ago Essential hypertension   [facility], Kyung Rudd, NP   2 years ago Essential hypertension   [facility] [place], [place], DO   3 years ago Encounter for female birth control   [facility], Kyung Rudd, NP              Passed - Ca in normal range and within 360 days    Calcium  Date Value Ref Range Status  11/22/2020 9.6 8.6 - 10.2 mg/dL Final          Passed - Cr in normal range and within 360 days    Creat  Date Value Ref Range Status  11/22/2020 0.88 0.50 - 1.10 mg/dL Final   Creatinine, Urine  Date Value Ref Range Status  08/28/2016 25 mg/dL Final          Passed - K in normal range and within 360 days    Potassium  Date Value Ref Range Status  11/22/2020 3.9 3.5 - 5.3 mmol/L Final          Passed - Na in normal range and within 360 days    Sodium  Date Value Ref Range Status  11/22/2020 140 135 - 146 mmol/L Final          Passed - Last BP in normal range    BP Readings from Last 1 Encounters:  11/22/20 116/63

## 2021-11-26 ENCOUNTER — Other Ambulatory Visit: Payer: Self-pay

## 2021-11-26 ENCOUNTER — Ambulatory Visit (INDEPENDENT_AMBULATORY_CARE_PROVIDER_SITE_OTHER): Payer: Medicaid Other | Admitting: Internal Medicine

## 2021-11-26 ENCOUNTER — Other Ambulatory Visit (HOSPITAL_COMMUNITY)
Admission: RE | Admit: 2021-11-26 | Discharge: 2021-11-26 | Disposition: A | Discharge status: Home / Self Care | Payer: Medicaid Other | Source: Ambulatory Visit | Attending: Internal Medicine | Admitting: Internal Medicine

## 2021-11-26 ENCOUNTER — Encounter: Payer: Self-pay | Admitting: Internal Medicine

## 2021-11-26 VITALS — BP 109/68 | HR 66 | Temp 97.3°F | Ht 69.0 in | Wt 147.0 lb

## 2021-11-26 DIAGNOSIS — Z1211 Encounter for screening for malignant neoplasm of colon: Secondary | ICD-10-CM

## 2021-11-26 DIAGNOSIS — N898 Other specified noninflammatory disorders of vagina: Secondary | ICD-10-CM | POA: Diagnosis not present

## 2021-11-26 DIAGNOSIS — I10 Essential (primary) hypertension: Secondary | ICD-10-CM

## 2021-11-26 DIAGNOSIS — Z1231 Encounter for screening mammogram for malignant neoplasm of breast: Secondary | ICD-10-CM | POA: Diagnosis not present

## 2021-11-26 DIAGNOSIS — Z0001 Encounter for general adult medical examination with abnormal findings: Secondary | ICD-10-CM

## 2021-11-26 MED ORDER — HYDROCHLOROTHIAZIDE 25 MG PO TABS: 25.0000 mg | ORAL_TABLET | Freq: Every day | ORAL | 3 refills | Status: DC

## 2021-11-26 NOTE — Addendum Note (Signed)
Addended by: Ashley Royalty E on: 11/26/2021 11:17 AM ? ? Modules accepted: Orders ? ?

## 2021-11-26 NOTE — Progress Notes (Signed)
? ?Subjective:  ? ? Patient ID: Jasmine Pruitt, female    DOB: July 05, 1976, 46 y.o.   MRN: [id number] ? ?HPI ? ?Patient presents to clinic today for her annual exam. ? ?HTN: Her BP today is.  She is taking HCTZ as prescribed.  ECG from 08/2016 reviewed. ? ?She also reports vaginal discharge.  She reports the discharge is white in color she denies pelvic pain, odor, abnormal vaginal bleeding or urinary symptoms.  She has not tried anything OTC for this. ? ? ?Flu:  never ?Tetanus: > 1 years ?COVID: never ?Pap smear: 09/2019 ?Mammogram: Never ?Colon screening: Never ?Vision screening: annually ?Dentist: as needed ? ?Diet: She does eat meat. She consumes fruits and veggies. She does eat some fried foods. She drinks mostly water, soda. ?Exercise: None ? ? ?Review of Systems ? ?   ?Past Medical History:  ?Diagnosis Date  ? Anemia   ? Iron deficiency  ? Heart murmur   ? as a child - resolved  ? Hypertension   ? ? ?Current Outpatient Medications  ?Medication Sig Dispense Refill  ? hydrochlorothiazide (HYDRODIURIL) 25 MG tablet TAKE 1 TABLET(25 MG) BY MOUTH DAILY 90 tablet 0  ? metroNIDAZOLE (METROGEL VAGINAL) 0.75 % vaginal gel Place 1 Applicatorful vaginally 2 (two) times daily. 70 g 0  ? Multiple Vitamin (MULTIVITAMIN) tablet Take 1 tablet by mouth daily.    ? ?No current facility-administered medications for this visit.  ? ? ?Allergies  ?Allergen Reactions  ? Sulfa Antibiotics Rash and Other (See Comments)  ?  Other reaction(s): Unknown  ? ? ?Family History  ?Problem Relation Age of Onset  ? Diabetes Mother   ? Schizophrenia Mother   ? Hypertension Father   ? Heart attack Father   ? Healthy Sister   ? Healthy Brother   ? Breast cancer Maternal Aunt   ? Healthy Sister   ? Healthy Brother   ? Stroke Neg Hx   ? Colon cancer Neg Hx   ? Ovarian cancer Neg Hx   ? ? ?Social History  ? ?Socioeconomic History  ? Marital status: Single  ?  Spouse name: Not on file  ? Number of children: Not on file  ? Years of education: Not on file  ?  Highest education level: Not on file  ?Occupational History  ? Not on file  ?Tobacco Use  ? Smoking status: Never  ? Smokeless tobacco: Never  ?Vaping Use  ? Vaping Use: Never used  ?Substance and Sexual Activity  ? Alcohol use: Not Currently  ?  Comment: occasionally  ? Drug use: No  ?  Comment: occasional marijuana 1-2 x per month  ? Sexual activity: Yes  ?  Birth control/protection: Injection  ?  Comment: Depo - on and off since 46 years old  ?Other Topics Concern  ? Not on file  ?Social History Narrative  ? Not on file  ? ?Social Determinants of Health  ? ?Financial Resource Strain: Not on file  ?Food Insecurity: Not on file  ?Transportation Needs: Not on file  ?Physical Activity: Not on file  ?Stress: Not on file  ?Social Connections: Not on file  ?Intimate Partner Violence: Not on file  ? ? ? ?Constitutional: Denies fever, malaise, fatigue, headache or abrupt weight changes.  ?HEENT: Denies eye pain, eye redness, ear pain, ringing in the ears, wax buildup, runny nose, nasal congestion, bloody nose, or sore throat. ?Respiratory: Denies difficulty breathing, shortness of breath, cough or sputum production.   ?Cardiovascular: Denies chest  pain, chest tightness, palpitations or swelling in the hands or feet.  ?Gastrointestinal: Denies abdominal pain, bloating, constipation, diarrhea or blood in the stool.  ?GU: Patient reports vaginal discharge.  Denies urgency, frequency, pain with urination, burning sensation, blood in urine, odor. ?Musculoskeletal: Denies decrease in range of motion, difficulty with gait, muscle pain or joint pain and swelling.  ?Skin: Denies redness, rashes, lesions or ulcercations.  ?Neurological: Denies dizziness, difficulty with memory, difficulty with speech or problems with balance and coordination.  ?Psych: Denies anxiety, depression, SI/HI. ? ?No other specific complaints in a complete review of systems (except as listed in HPI above). ? ?Objective:  ? Physical Exam ? ?BP 109/68 (BP  Location: Left Arm, Patient Position: Sitting, Cuff Size: Normal)   Pulse 66   Temp (!) 97.3 ?F (36.3 ?C) (Temporal)   Ht '5\' 9"'  (1.753 m)   Wt 147 lb (66.7 kg)   SpO2 100%   BMI 21.71 kg/m?  ? ?Wt Readings from Last 3 Encounters:  ?11/22/20 154 lb 9.6 oz (70.1 kg)  ?09/29/19 152 lb 6.4 oz (69.1 kg)  ?05/24/19 141 lb 3.2 oz (64 kg)  ? ? ?General: Appears her stated age, well developed, well nourished in NAD. ?Skin: Warm, dry and intact.  ?HEENT: Head: normal shape and size; Eyes: sclera white and EOMs intact;  ?Neck:  Neck supple, trachea midline. No masses, lumps or thyromegaly present.  ?Cardiovascular: Normal rate and rhythm. S1,S2 noted.  No murmur, rubs or gallops noted. No JVD or BLE edema.  ?Pulmonary/Chest: Normal effort and positive vesicular breath sounds. No respiratory distress. No wheezes, rales or ronchi noted.  ?Abdomen: Soft and nontender. Normal bowel sounds.  ?Musculoskeletal: Strength 5/5 BUE/BLE.  No difficulty with gait.  ?Neurological: Alert and oriented. Cranial nerves II-XII grossly intact. Coordination normal.  ?Psychiatric: Mood and affect normal. Behavior is normal. Judgment and thought content normal.  ? ? ? ?BMET ?   ?Component Value Date/Time  ? NA 140 11/22/2020 0859  ? K 3.9 11/22/2020 0859  ? CL 104 11/22/2020 0859  ? CO2 30 11/22/2020 0859  ? GLUCOSE 91 11/22/2020 0859  ? BUN 16 11/22/2020 0859  ? CREATININE 0.88 11/22/2020 0859  ? CALCIUM 9.6 11/22/2020 0859  ? GFRNONAA 79 05/24/2019 0900  ? GFRAA 91 05/24/2019 0900  ? ? ?Lipid Panel  ?   ?Component Value Date/Time  ? CHOL 187 11/22/2020 0859  ? TRIG 64 11/22/2020 0859  ? HDL 70 11/22/2020 0859  ? CHOLHDL 2.7 11/22/2020 0859  ? VLDL 12 03/02/2017 0834  ? LDLCALC 102 (H) 11/22/2020 0859  ? ? ?CBC ?   ?Component Value Date/Time  ? WBC 7.8 11/22/2020 0859  ? RBC 5.23 (H) 11/22/2020 0859  ? HGB 14.9 11/22/2020 0859  ? HCT 45.8 (H) 11/22/2020 0859  ? PLT 395 11/22/2020 0859  ? MCV 87.6 11/22/2020 0859  ? MCH 28.5 11/22/2020 0859  ?  MCHC 32.5 11/22/2020 0859  ? RDW 12.2 11/22/2020 0859  ? LYMPHSABS 1,396 11/22/2020 0859  ? MONOABS 0.3 09/07/2016 1839  ? EOSABS 8 (L) 11/22/2020 0859  ? BASOSABS 31 11/22/2020 0859  ? ? ?Hgb A1C ?No results found for: HGBA1C ? ? ? ? ? ?   ?Assessment & Plan:  ? ?Preventative Health Maintenance: ? ?Encouraged her to get a flu shot in fall ?She declines tetanus vaccine today, advised if she gets bit or cut to have this done ?Encouraged her to get her COVID-vaccine ?Pap smear UTD ?Mammogram ordered-she will call to  schedule ?Referral to GI for screening colonoscopy ?Encouraged her to consume a balanced diet and exercise regimen ?Advised her to see an eye doctor and dentist annually ?We will check CBC, c-Met, lipid profile today ? ?Vaginal discharge: ? ?We will obtain wet prep ? ?RTC in 1 year, sooner if needed ?Webb Silversmith, NP ?This visit occurred during the SARS-CoV-2 public health emergency.  Safety protocols were in place, including screening questions prior to the visit, additional usage of staff PPE, and extensive cleaning of exam room while observing appropriate contact time as indicated for disinfecting solutions.  ? ?

## 2021-11-26 NOTE — Patient Instructions (Signed)

## 2021-11-26 NOTE — Assessment & Plan Note (Signed)
Controlled on HCTZ, refilled today ?Reinforced DASH diet ?We will monitor ?

## 2021-11-27 LAB — CBC
HCT: 38.2 % (ref 35.0–45.0)
Hemoglobin: 12.2 g/dL (ref 11.7–15.5)
MCH: 28.1 pg (ref 27.0–33.0)
MCHC: 31.9 g/dL — ABNORMAL LOW (ref 32.0–36.0)
MCV: 88 fL (ref 80.0–100.0)
MPV: 11.7 fL (ref 7.5–12.5)
Platelets: 311 10*3/uL (ref 140–400)
RBC: 4.34 10*6/uL (ref 3.80–5.10)
RDW: 12.7 % (ref 11.0–15.0)
WBC: 7 10*3/uL (ref 3.8–10.8)

## 2021-11-27 LAB — COMPLETE METABOLIC PANEL WITH GFR
AG Ratio: 1.6 (calc) (ref 1.0–2.5)
ALT: 17 U/L (ref 6–29)
AST: 20 U/L (ref 10–30)
Albumin: 3.8 g/dL (ref 3.6–5.1)
Alkaline phosphatase (APISO): 49 U/L (ref 31–125)
BUN: 8 mg/dL (ref 7–25)
CO2: 30 mmol/L (ref 20–32)
Calcium: 8.9 mg/dL (ref 8.6–10.2)
Chloride: 106 mmol/L (ref 98–110)
Creat: 0.91 mg/dL (ref 0.50–0.99)
Globulin: 2.4 g/dL (calc) (ref 1.9–3.7)
Glucose, Bld: 64 mg/dL — ABNORMAL LOW (ref 65–139)
Potassium: 3.7 mmol/L (ref 3.5–5.3)
Sodium: 141 mmol/L (ref 135–146)
Total Bilirubin: 0.2 mg/dL (ref 0.2–1.2)
Total Protein: 6.2 g/dL (ref 6.1–8.1)
eGFR: 80 mL/min/{1.73_m2} (ref >=60)

## 2021-11-27 LAB — LIPID PANEL
Cholesterol: 139 mg/dL (ref <200)
HDL: 60 mg/dL (ref >=50)
LDL Cholesterol (Calc): 65 mg/dL (calc)
Non-HDL Cholesterol (Calc): 79 mg/dL (calc) (ref <130)
Total CHOL/HDL Ratio: 2.3 (calc) (ref <5.0)
Triglycerides: 60 mg/dL (ref <150)

## 2021-11-27 LAB — CERVICOVAGINAL ANCILLARY ONLY
Bacterial Vaginitis (gardnerella): POSITIVE — AB
Candida Glabrata: NEGATIVE
Candida Vaginitis: NEGATIVE
Comment: NEGATIVE
Comment: NEGATIVE
Comment: NEGATIVE

## 2021-11-28 MED ORDER — METRONIDAZOLE 500 MG PO TABS: 500.0000 mg | ORAL_TABLET | Freq: Two times a day (BID) | ORAL | 0 refills | Status: DC

## 2021-11-28 NOTE — Addendum Note (Signed)
Addended by: Lorre Munroe on: 11/28/2021 02:54 PM ? ? Modules accepted: Orders ? ?

## 2022-01-27 ENCOUNTER — Other Ambulatory Visit: Payer: Self-pay | Admitting: Internal Medicine

## 2022-01-27 DIAGNOSIS — I10 Essential (primary) hypertension: Secondary | ICD-10-CM

## 2022-01-28 NOTE — Telephone Encounter (Signed)
Refused the Hydrodiuril 25 mg.  It was received at [facility] #[id number] on 11/26/2021 at 8:52 AM, #90 with 3 refills.   Refills remaining. ?

## 2022-02-13 ENCOUNTER — Encounter: Payer: Self-pay | Admitting: Internal Medicine

## 2022-02-13 ENCOUNTER — Ambulatory Visit: Payer: Medicaid Other | Admitting: Internal Medicine

## 2022-02-13 VITALS — BP 128/76 | HR 72 | Temp 97.3°F | Wt 137.0 lb

## 2022-02-13 DIAGNOSIS — N926 Irregular menstruation, unspecified: Secondary | ICD-10-CM | POA: Diagnosis not present

## 2022-02-13 DIAGNOSIS — R5383 Other fatigue: Secondary | ICD-10-CM | POA: Diagnosis not present

## 2022-02-13 NOTE — Patient Instructions (Signed)
Menorrhagia Menorrhagia is when your monthly periods are heavy or last longer than normal. If you have this condition, bleeding and cramping may make it hard for you to do your daily activities. What are the causes? Common causes of this condition include: Growths in the womb (uterus). These are polyps or fibroids. These growths are not cancer. Problems with two hormones called estrogen and progesterone. One of the ovaries not releasing an egg during one or more months. A problem with the thyroid gland. Having a device for birth control (IUD). Side effects of some medicines, such as NSAIDs or blood thinners. A disorder that stops the blood from clotting normally. What increases the risk? You are more likely to have this condition if you have cancer of the womb. What are the signs or symptoms? Having to change your pad or tampon every 1-2 hours because it is soaked. Needing to use pads and tampons at the same time because of heavy bleeding. Needing to wake up to change your pads or tampons during the night. Passing blood clots larger than 1 inch (2.5 cm) in size. Having bleeding that lasts for more than 7 days. Having symptoms of low iron levels (anemia), such as feeling tired or having shortness of breath. How is this treated? You may not need to be treated for this condition. But if you need treatment, you may be given medicines: To reduce bleeding during your period. These include birth control medicines. To make your blood thick. This slows bleeding. To reduce swelling. Medicines that do this include ibuprofen. That have a hormone called progestin. That make the ovaries stop working for a short time. To treat low iron levels. You will be given iron pills if you have this condition. If medicines do not work, surgery may be done. Surgery may be done to: Remove a part of the lining of the womb. This lining is called the endometrium. This reduces bleeding during a period. Remove growths  in the womb. These may be polyps or fibroids. Remove the entire lining of the womb. Remove the womb entirely. This procedure is called a hysterectomy. Follow these instructions at home: Medicines Take over-the-counter and prescription medicines only as told by your doctor. This includes iron pills. Do not change or switch medicines without asking your doctor. Do not take aspirin or medicines that contain aspirin 1 week before or during your period. Aspirin may make bleeding worse. Managing constipation Iron pills may cause trouble pooping (constipation). To prevent or treat problems when pooping, you may need to: Drink enough fluid to keep your pee (urine) pale yellow. Take over-the-counter or prescription medicines. Eat foods that are high in fiber. These include beans, whole grains, and fresh fruits and vegetables. Limit foods that are high in fat and sugar. These include fried or sweet foods. General instructions If you need to change your pad or tampon more than once every 2 hours, limit your activity until the bleeding stops. Eat healthy meals and foods that are high in iron. Foods that have a lot of iron include: Leafy green vegetables. Meat. Liver. Eggs. Whole-grain breads and cereals. Do not try to lose weight until your heavy bleeding has stopped and you have normal amounts of iron in your blood. If you need to lose weight, work with your doctor. Keep all follow-up visits. Contact a doctor if: You soak through a pad or tampon every 1 or 2 hours, and this happens every time you have a period. You need to use pads and   tampons at the same time because you are bleeding so much. You are taking medicine, and: You feel like you may vomit. You vomit. You have watery poop (diarrhea). You have other problems that may be related to the medicine you are taking. Get help right away if: You soak through more than a pad or tampon in 1 hour. You pass clots bigger than 1 inch (2.5 cm)  wide. You feel short of breath. You feel like your heart is beating too fast. You feel dizzy or you faint. You feel very weak or tired. Summary Menorrhagia is when your menstrual periods are heavy or last longer than normal. You may not need to be treated for this condition. If you need treatment, you may be given medicines or have surgery. Take over-the-counter and prescription medicines only as told by your doctor. This includes iron pills. Get help right away if you soak through more than a pad or tampon in 1 hour or you pass large clots. Also, get help right away if you feel dizzy, short of breath, or very weak or tired. This information is not intended to replace advice given to you by your health care provider. Make sure you discuss any questions you have with your health care provider. Document Revised: 05/15/2020 Document Reviewed: 05/15/2020 Elsevier Patient Education  2023 Elsevier Inc.  

## 2022-02-13 NOTE — Progress Notes (Signed)
Subjective:    Patient ID: Jasmine Pruitt, female    DOB: [birth year], 46 y.o.   MRN: [id number]  HPI  Patient presents to clinic today with c/o irregular periods and concern for anemia. She reports this started a few months ago. She does feel fatigued. She does not feel like she is pale, cold intolerant or shortness of breath. She is not having any menopausal symptoms. Her last H/H was 12.2/38.2, 11/2021. She is not currently on any type of hormonal therapy.  Review of Systems     Past Medical History:  Diagnosis Date   Anemia    Iron deficiency   Heart murmur    as a child - resolved   Hypertension     Current Outpatient Medications  Medication Sig Dispense Refill   hydrochlorothiazide (HYDRODIURIL) 25 MG tablet Take 1 tablet (25 mg total) by mouth daily. 90 tablet 3   metroNIDAZOLE (FLAGYL) 500 MG tablet Take 1 tablet (500 mg total) by mouth 2 (two) times daily. One po bid x 7 days 14 tablet 0   Multiple Vitamin (MULTIVITAMIN) tablet Take 1 tablet by mouth daily.     No current facility-administered medications for this visit.    Allergies  Allergen Reactions   Sulfa Antibiotics Rash and Other (See Comments)    Other reaction(s): Unknown    Family History  Problem Relation Age of Onset   Diabetes Mother    Schizophrenia Mother    Hypertension Father    Heart attack Father    Healthy Sister    Healthy Brother    Breast cancer Maternal Aunt    Healthy Sister    Healthy Brother    Stroke Neg Hx    Colon cancer Neg Hx    Ovarian cancer Neg Hx     Social History   Socioeconomic History   Marital status: Single    Spouse name: Not on file   Number of children: Not on file   Years of education: Not on file   Highest education level: Not on file  Occupational History   Not on file  Tobacco Use   Smoking status: Never   Smokeless tobacco: Never  Vaping Use   Vaping Use: Never used  Substance and Sexual Activity   Alcohol use: Not Currently    Comment:  occasionally   Drug use: No    Comment: occasional marijuana 1-2 x per month   Sexual activity: Yes    Birth control/protection: Injection    Comment: Depo - on and off since 46 years old  Other Topics Concern   Not on file  Social History Narrative   Not on file   Social Determinants of Health   Financial Resource Strain: Not on file  Food Insecurity: Not on file  Transportation Needs: Not on file  Physical Activity: Not on file  Stress: Not on file  Social Connections: Not on file  Intimate Partner Violence: Not on file     Constitutional: Patient reports fatigue.  Denies fever, malaise, headache or abrupt weight changes.  Respiratory: Denies difficulty breathing, shortness of breath, cough or sputum production.   Cardiovascular: Denies chest pain, chest tightness, palpitations or swelling in the hands or feet.  Gastrointestinal: Denies abdominal pain, bloating, constipation, diarrhea or blood in the stool.  GU: Patient reports irregular periods.  Denies urgency, frequency, pain with urination, burning sensation, blood in urine, odor or discharge. Skin: Denies redness, rashes, lesions or ulcercations.    No other specific complaints in  a complete review of systems (except as listed in HPI above).  Objective:   Physical Exam  BP 128/76 (BP Location: Left Arm, Patient Position: Sitting, Cuff Size: Normal)   Pulse 72   Temp (!) 97.3 F (36.3 C) (Temporal)   Wt 137 lb (62.1 kg)   SpO2 100%   BMI 20.23 kg/m   Wt Readings from Last 3 Encounters:  11/26/21 147 lb (66.7 kg)  11/22/20 154 lb 9.6 oz (70.1 kg)  09/29/19 152 lb 6.4 oz (69.1 kg)    General: Appears her stated age, well developed, well nourished in NAD. Skin: Warm, dry and intact.  Neck:  Neck supple, trachea midline. No masses, lumps or thyromegaly present.  Cardiovascular: Normal rate and rhythm. S1,S2 noted.  No murmur, rubs or gallops noted.  Pulmonary/Chest: Normal effort and positive vesicular breath  sounds. No respiratory distress. No wheezes, rales or ronchi noted.  Abdomen: Soft and nontender.  Musculoskeletal:  No difficulty with gait.  Neurological: Alert and oriented.  BMET    Component Value Date/Time   NA 141 11/26/2021 0901   K 3.7 11/26/2021 0901   CL 106 11/26/2021 0901   CO2 30 11/26/2021 0901   GLUCOSE 64 (L) 11/26/2021 0901   BUN 8 11/26/2021 0901   CREATININE 0.91 11/26/2021 0901   CALCIUM 8.9 11/26/2021 0901   GFRNONAA 79 05/24/2019 0900   GFRAA 91 05/24/2019 0900    Lipid Panel     Component Value Date/Time   CHOL 139 11/26/2021 0901   TRIG 60 11/26/2021 0901   HDL 60 11/26/2021 0901   CHOLHDL 2.3 11/26/2021 0901   VLDL 12 03/02/2017 0834   LDLCALC 65 11/26/2021 0901    CBC    Component Value Date/Time   WBC 7.0 11/26/2021 0901   RBC 4.34 11/26/2021 0901   HGB 12.2 11/26/2021 0901   HCT 38.2 11/26/2021 0901   PLT 311 11/26/2021 0901   MCV 88.0 11/26/2021 0901   MCH 28.1 11/26/2021 0901   MCHC 31.9 (L) 11/26/2021 0901   RDW 12.7 11/26/2021 0901   LYMPHSABS 1,396 11/22/2020 0859   MONOABS 0.3 09/07/2016 1839   EOSABS 8 (L) 11/22/2020 0859   BASOSABS 31 11/22/2020 0859    Hgb A1C No results found for: HGBA1C         Assessment & Plan:   Fatigue, Irregular Menses:  Discussed with her that she may be perimenopausal We will check CBC, iron panel and TSH today She would be interested in Depo Provera pending her labs  We will follow-up after labs with further recommendation and treatment plan  Nicki Reaper, NP

## 2022-02-14 LAB — CBC
HCT: 40.8 % (ref 35.0–45.0)
Hemoglobin: 13.1 g/dL (ref 11.7–15.5)
MCH: 28.4 pg (ref 27.0–33.0)
MCHC: 32.1 g/dL (ref 32.0–36.0)
MCV: 88.3 fL (ref 80.0–100.0)
MPV: 12 fL (ref 7.5–12.5)
Platelets: 319 10*3/uL (ref 140–400)
RBC: 4.62 10*6/uL (ref 3.80–5.10)
RDW: 12.4 % (ref 11.0–15.0)
WBC: 8.1 10*3/uL (ref 3.8–10.8)

## 2022-02-14 LAB — TSH: TSH: 0.46 mIU/L

## 2022-02-14 LAB — IRON,TIBC AND FERRITIN PANEL
%SAT: 33 % (calc) (ref 16–45)
Ferritin: 67 ng/mL (ref 16–232)
Iron: 83 ug/dL (ref 40–190)
TIBC: 254 mcg/dL (calc) (ref 250–450)

## 2022-02-17 ENCOUNTER — Other Ambulatory Visit: Payer: Self-pay

## 2022-02-17 NOTE — Telephone Encounter (Signed)
Pt advised.  She would like to start Depo Provera.  Please send to [facility] in [place].  Apt scheduled for 02/27/2022.    Thanks,   -Vernona Rieger

## 2022-02-17 NOTE — Telephone Encounter (Signed)
-----   Message from Lorre Munroe, NP sent at 02/14/2022 11:24 AM EDT ----- Blood counts are normal.  Iron levels are normal.  Her thyroid function is normal.  If she would like to start the Depo-Provera, please have her call and schedule a nurse visit

## 2022-02-18 MED ORDER — MEDROXYPROGESTERONE ACETATE 150 MG/ML IM SUSP: 150.0000 mg | INTRAMUSCULAR | 1 refills | Status: DC

## 2022-02-27 ENCOUNTER — Ambulatory Visit (INDEPENDENT_AMBULATORY_CARE_PROVIDER_SITE_OTHER): Payer: Medicaid Other

## 2022-02-27 DIAGNOSIS — Z3202 Encounter for pregnancy test, result negative: Secondary | ICD-10-CM | POA: Diagnosis not present

## 2022-02-27 DIAGNOSIS — Z3042 Encounter for surveillance of injectable contraceptive: Secondary | ICD-10-CM

## 2022-02-27 LAB — POCT URINE PREGNANCY: Preg Test, Ur: NEGATIVE

## 2022-02-27 MED ORDER — MEDROXYPROGESTERONE ACETATE 150 MG/ML IM SUSP
150.0000 mg | Freq: Once | INTRAMUSCULAR | Status: AC
  Administered 2022-02-27: 150 mg via INTRAMUSCULAR

## 2022-05-22 ENCOUNTER — Telehealth: Payer: Self-pay

## 2022-05-22 DIAGNOSIS — Z1231 Encounter for screening mammogram for malignant neoplasm of breast: Secondary | ICD-10-CM

## 2022-05-22 NOTE — Telephone Encounter (Signed)
Is this okay to order?  Thanks,   -Kadeem Hyle  

## 2022-05-22 NOTE — Telephone Encounter (Signed)
Copied from CRM [id number]. Topic: General - Other >> May 22, 2022  1:49 PM [id number] wrote: Reason for CRM: pt called in to request to have orders placed to have her mammogram. Pt says that she would need it scheduled for after 3p.

## 2022-05-23 NOTE — Telephone Encounter (Signed)
Order placed. She needs to call [place] and schedule this herself.

## 2022-07-01 ENCOUNTER — Ambulatory Visit: Payer: Medicaid Other | Admitting: Internal Medicine

## 2022-07-01 ENCOUNTER — Encounter: Payer: Self-pay | Admitting: Internal Medicine

## 2022-07-01 ENCOUNTER — Other Ambulatory Visit (HOSPITAL_COMMUNITY)
Admission: RE | Admit: 2022-07-01 | Discharge: 2022-07-01 | Disposition: A | Discharge status: Home / Self Care | Payer: Medicaid Other | Source: Ambulatory Visit | Attending: Internal Medicine | Admitting: Internal Medicine

## 2022-07-01 VITALS — BP 122/80 | HR 66 | Temp 97.1°F | Wt 151.0 lb

## 2022-07-01 DIAGNOSIS — N898 Other specified noninflammatory disorders of vagina: Secondary | ICD-10-CM | POA: Diagnosis present

## 2022-07-01 NOTE — Progress Notes (Signed)
Subjective:    Patient ID: Jasmine Pruitt, female    DOB: May 17, 1976, 46 y.o.   MRN: [id number]  HPI  Pt presents to the clinic today with c/o vaginal discharge. She reports this started a few weeks ago. The discharge is clear, without odor. She denies pelvic pain, vaginal irritation, itching, abnormal vaginal bleeding.  She denies urinary symptoms.  She denies fever chills, nausea or vomiting.  She has not tried anything OTC for this.  She has not been sexually active in 3 to 4 months and is not concerned about STDs at this time.   Review of Systems  Past Medical History:  Diagnosis Date   Anemia    Iron deficiency   Heart murmur    as a child - resolved   Hypertension     Current Outpatient Medications  Medication Sig Dispense Refill   hydrochlorothiazide (HYDRODIURIL) 25 MG tablet Take 1 tablet (25 mg total) by mouth daily. 90 tablet 3   medroxyPROGESTERone (DEPO-PROVERA) 150 MG/ML injection Inject 1 mL (150 mg total) into the muscle every 3 (three) months. 1 mL 1   Multiple Vitamin (MULTIVITAMIN) tablet Take 1 tablet by mouth daily.     No current facility-administered medications for this visit.    Allergies  Allergen Reactions   Sulfa Antibiotics Rash and Other (See Comments)    Other reaction(s): Unknown    Family History  Problem Relation Age of Onset   Diabetes Mother    Schizophrenia Mother    Hypertension Father    Heart attack Father    Healthy Sister    Healthy Brother    Breast cancer Maternal Aunt    Healthy Sister    Healthy Brother    Stroke Neg Hx    Colon cancer Neg Hx    Ovarian cancer Neg Hx     Social History   Socioeconomic History   Marital status: Single    Spouse name: Not on file   Number of children: Not on file   Years of education: Not on file   Highest education level: Not on file  Occupational History   Not on file  Tobacco Use   Smoking status: Never   Smokeless tobacco: Never  Vaping Use   Vaping Use: Never used   Substance and Sexual Activity   Alcohol use: Not Currently    Comment: occasionally   Drug use: No    Comment: occasional marijuana 1-2 x per month   Sexual activity: Yes    Birth control/protection: Injection    Comment: Depo - on and off since 46 years old  Other Topics Concern   Not on file  Social History Narrative   Not on file   Social Determinants of Health   Financial Resource Strain: Not on file  Food Insecurity: Not on file  Transportation Needs: Not on file  Physical Activity: Not on file  Stress: Not on file  Social Connections: Not on file  Intimate Partner Violence: Not on file     Constitutional: Denies fever, malaise, fatigue, headache or abrupt weight changes.  Respiratory: Denies difficulty breathing, shortness of breath, cough or sputum production.   Cardiovascular: Denies chest pain, chest tightness, palpitations or swelling in the hands or feet.  Gastrointestinal: Denies abdominal pain, bloating, constipation, diarrhea or blood in the stool.  GU: Patient reports vaginal discharge.  Denies urgency, frequency, pain with urination, burning sensation, blood in urine, odor.  No other specific complaints in a complete review of systems (except  as listed in HPI above).     Objective:   Physical Exam  BP 122/80 (BP Location: Right Arm, Patient Position: Sitting, Cuff Size: Normal)   Pulse 66   Temp (!) 97.1 F (36.2 C) (Temporal)   Wt 151 lb (68.5 kg)   SpO2 100%   BMI 22.30 kg/m  Wt Readings from Last 3 Encounters:  07/01/22 151 lb (68.5 kg)  02/13/22 137 lb (62.1 kg)  11/26/21 147 lb (66.7 kg)    General: Appears her stated age, well developed, well nourished in NAD. Cardiovascular: Normal rate and rhythm.  Pulmonary/Chest: Normal effort and positive vesicular breath sounds. No respiratory distress. No wheezes, rales or ronchi noted.  GU: Self swab. Neurological: Alert and oriented.   BMET    Component Value Date/Time   NA 141 11/26/2021  0901   K 3.7 11/26/2021 0901   CL 106 11/26/2021 0901   CO2 30 11/26/2021 0901   GLUCOSE 64 (L) 11/26/2021 0901   BUN 8 11/26/2021 0901   CREATININE 0.91 11/26/2021 0901   CALCIUM 8.9 11/26/2021 0901   GFRNONAA 79 05/24/2019 0900   GFRAA 91 05/24/2019 0900    Lipid Panel     Component Value Date/Time   CHOL 139 11/26/2021 0901   TRIG 60 11/26/2021 0901   HDL 60 11/26/2021 0901   CHOLHDL 2.3 11/26/2021 0901   VLDL 12 03/02/2017 0834   LDLCALC 65 11/26/2021 0901    CBC    Component Value Date/Time   WBC 8.1 02/13/2022 1028   RBC 4.62 02/13/2022 1028   HGB 13.1 02/13/2022 1028   HCT 40.8 02/13/2022 1028   PLT 319 02/13/2022 1028   MCV 88.3 02/13/2022 1028   MCH 28.4 02/13/2022 1028   MCHC 32.1 02/13/2022 1028   RDW 12.4 02/13/2022 1028   LYMPHSABS 1,396 11/22/2020 0859   MONOABS 0.3 09/07/2016 1839   EOSABS 8 (L) 11/22/2020 0859   BASOSABS 31 11/22/2020 0859    Hgb A1C No results found for: "HGBA1C"          Assessment & Plan:   Vaginal Discharge:  We will check wet prep for BV and yeast She declines STD screening at this time  We will follow-up after labs with further recommendation and treatment plan Nicki Reaper, NP

## 2022-07-03 LAB — CERVICOVAGINAL ANCILLARY ONLY
Bacterial Vaginitis (gardnerella): POSITIVE — AB
Candida Glabrata: NEGATIVE
Candida Vaginitis: NEGATIVE
Comment: NEGATIVE
Comment: NEGATIVE
Comment: NEGATIVE

## 2022-07-03 MED ORDER — METRONIDAZOLE 0.75 % EX GEL: 1.0000 | Freq: Two times a day (BID) | CUTANEOUS | 0 refills | Status: DC

## 2022-07-03 NOTE — Addendum Note (Signed)
Addended by: Jearld Fenton on: 07/03/2022 02:29 PM   Modules accepted: Orders

## 2022-07-17 ENCOUNTER — Encounter: Payer: Self-pay | Admitting: Internal Medicine

## 2022-07-17 ENCOUNTER — Ambulatory Visit (INDEPENDENT_AMBULATORY_CARE_PROVIDER_SITE_OTHER): Payer: Medicaid Other | Admitting: Internal Medicine

## 2022-07-17 VITALS — BP 116/86 | HR 76 | Temp 96.9°F | Wt 145.0 lb

## 2022-07-17 DIAGNOSIS — M21612 Bunion of left foot: Secondary | ICD-10-CM

## 2022-07-17 DIAGNOSIS — M21611 Bunion of right foot: Secondary | ICD-10-CM

## 2022-07-17 NOTE — Patient Instructions (Signed)

## 2022-07-17 NOTE — Progress Notes (Signed)
Subjective:    Patient ID: Jasmine Pruitt, female    DOB: [birth year], 46 y.o.   MRN: [id number]  HPI  Patient presents to clinic today with complaint of bilateral bunions.  This has been an ongoing issue for her.  She describes the pain as sore and achy but can be sharp and stabbing with ambulation.  She reports intermittent swelling of the area.  She has not noticed any redness or warmth.  She has not taken any medications OTC for this.  She reports she gets her children to rub her feet to help with the pain.  Review of Systems     Past Medical History:  Diagnosis Date   Anemia    Iron deficiency   Heart murmur    as a child - resolved   Hypertension     Current Outpatient Medications  Medication Sig Dispense Refill   hydrochlorothiazide (HYDRODIURIL) 25 MG tablet Take 1 tablet (25 mg total) by mouth daily. 90 tablet 3   medroxyPROGESTERone (DEPO-PROVERA) 150 MG/ML injection Inject 1 mL (150 mg total) into the muscle every 3 (three) months. 1 mL 1   metroNIDAZOLE (METROGEL) 0.75 % gel Apply 1 Application topically 2 (two) times daily. 45 g 0   Multiple Vitamin (MULTIVITAMIN) tablet Take 1 tablet by mouth daily.     No current facility-administered medications for this visit.    Allergies  Allergen Reactions   Sulfa Antibiotics Rash and Other (See Comments)    Other reaction(s): Unknown    Family History  Problem Relation Age of Onset   Diabetes Mother    Schizophrenia Mother    Hypertension Father    Heart attack Father    Healthy Sister    Healthy Brother    Breast cancer Maternal Aunt    Healthy Sister    Healthy Brother    Stroke Neg Hx    Colon cancer Neg Hx    Ovarian cancer Neg Hx     Social History   Socioeconomic History   Marital status: Single    Spouse name: Not on file   Number of children: Not on file   Years of education: Not on file   Highest education level: Not on file  Occupational History   Not on file  Tobacco Use   Smoking status:  Never   Smokeless tobacco: Never  Vaping Use   Vaping Use: Never used  Substance and Sexual Activity   Alcohol use: Not Currently    Comment: occasionally   Drug use: No    Comment: occasional marijuana 1-2 x per month   Sexual activity: Yes    Birth control/protection: Injection    Comment: Depo - on and off since 46 years old  Other Topics Concern   Not on file  Social History Narrative   Not on file   Social Determinants of Health   Financial Resource Strain: Not on file  Food Insecurity: Not on file  Transportation Needs: Not on file  Physical Activity: Not on file  Stress: Not on file  Social Connections: Not on file  Intimate Partner Violence: Not on file     Constitutional: Denies fever, malaise, fatigue, headache or abrupt weight changes.  Respiratory: Denies difficulty breathing, shortness of breath, cough or sputum production.   Cardiovascular: Denies chest pain, chest tightness, palpitations or swelling in the hands or feet.  Musculoskeletal: Patient reports bilateral bunions.  Denies decrease in range of motion, difficulty with gait, muscle pain or joint swelling.  Skin: Denies redness, rashes, lesions or ulcercations.  Neurological: Denies numbness, tingling, weakness or problems with balance and coordination.    No other specific complaints in a complete review of systems (except as listed in HPI above).  Objective:   Physical Exam   BP 116/86 (BP Location: Left Arm, Patient Position: Sitting, Cuff Size: Normal)   Pulse 76   Temp (!) 96.9 F (36.1 C) (Temporal)   Wt 145 lb (65.8 kg)   SpO2 100%   BMI 21.41 kg/m  Wt Readings from Last 3 Encounters:  07/17/22 145 lb (65.8 kg)  07/01/22 151 lb (68.5 kg)  02/13/22 137 lb (62.1 kg)    General: Appears her stated age, well developed, well nourished in NAD. Skin: Warm, dry and intact. No redness or warmth noted of bilateral feet. Cardiovascular: Normal rate and rhythm. Pulmonary/Chest: Normal effort  and positive vesicular breath sounds. No respiratory distress. No wheezes, rales or ronchi noted.  Musculoskeletal: Bunions noted of bilateral feet.  No difficulty with gait.  Neurological: Alert and oriented.   BMET    Component Value Date/Time   NA 141 11/26/2021 0901   K 3.7 11/26/2021 0901   CL 106 11/26/2021 0901   CO2 30 11/26/2021 0901   GLUCOSE 64 (L) 11/26/2021 0901   BUN 8 11/26/2021 0901   CREATININE 0.91 11/26/2021 0901   CALCIUM 8.9 11/26/2021 0901   GFRNONAA 79 05/24/2019 0900   GFRAA 91 05/24/2019 0900    Lipid Panel     Component Value Date/Time   CHOL 139 11/26/2021 0901   TRIG 60 11/26/2021 0901   HDL 60 11/26/2021 0901   CHOLHDL 2.3 11/26/2021 0901   VLDL 12 03/02/2017 0834   LDLCALC 65 11/26/2021 0901    CBC    Component Value Date/Time   WBC 8.1 02/13/2022 1028   RBC 4.62 02/13/2022 1028   HGB 13.1 02/13/2022 1028   HCT 40.8 02/13/2022 1028   PLT 319 02/13/2022 1028   MCV 88.3 02/13/2022 1028   MCH 28.4 02/13/2022 1028   MCHC 32.1 02/13/2022 1028   RDW 12.4 02/13/2022 1028   LYMPHSABS 1,396 11/22/2020 0859   MONOABS 0.3 09/07/2016 1839   EOSABS 8 (L) 11/22/2020 0859   BASOSABS 31 11/22/2020 0859    Hgb A1C No results found for: "HGBA1C"         Assessment & Plan:   Bunions, Bilateral Feet:  Patient requesting referral to podiatry for surgical evaluation, referral placed  RTC in 5 months for your annual exam Webb Silversmith, NP

## 2022-07-22 ENCOUNTER — Ambulatory Visit: Payer: Medicaid Other | Admitting: Podiatry

## 2022-07-29 ENCOUNTER — Ambulatory Visit: Payer: Medicaid Other | Admitting: Podiatry

## 2022-07-29 DIAGNOSIS — M2011 Hallux valgus (acquired), right foot: Secondary | ICD-10-CM

## 2022-07-29 DIAGNOSIS — M2012 Hallux valgus (acquired), left foot: Secondary | ICD-10-CM

## 2022-07-29 NOTE — Progress Notes (Signed)
  Subjective:  Patient ID: Jasmine Pruitt, female    DOB: 11-23-1975,  MRN: [id number]  Chief Complaint  Patient presents with   Bunions    46 y.o. female presents with the above complaint.  Patient presents with bilateral bunion deformity right greater than left side.  Hurts with ambulation hurts with pressure.  Patient states that she has tried all conservative care none of which has helped.  She would like to discuss surgical options at this time.  She has not seen anyone else prior to seeing me pain scale is 5 out of 10 dull achy in nature right greater than left.  Review of Systems: Negative except as noted in the HPI. Denies N/V/F/Ch.  Past Medical History:  Diagnosis Date   Anemia    Iron deficiency   Heart murmur    as a child - resolved   Hypertension     Current Outpatient Medications:    hydrochlorothiazide (HYDRODIURIL) 25 MG tablet, Take 1 tablet (25 mg total) by mouth daily., Disp: 90 tablet, Rfl: 3   medroxyPROGESTERone (DEPO-PROVERA) 150 MG/ML injection, Inject 1 mL (150 mg total) into the muscle every 3 (three) months., Disp: 1 mL, Rfl: 1   metroNIDAZOLE (METROGEL) 0.75 % gel, Apply 1 Application topically 2 (two) times daily., Disp: 45 g, Rfl: 0   Multiple Vitamin (MULTIVITAMIN) tablet, Take 1 tablet by mouth daily., Disp: , Rfl:   Social History   Tobacco Use  Smoking Status Never  Smokeless Tobacco Never    Allergies  Allergen Reactions   Sulfa Antibiotics Rash and Other (See Comments)    Other reaction(s): Unknown   Objective:  There were no vitals filed for this visit. There is no height or weight on file to calculate BMI. Constitutional Well developed. Well nourished.  Vascular Dorsalis pedis pulses palpable bilaterally. Posterior tibial pulses palpable bilaterally. Capillary refill normal to all digits.  No cyanosis or clubbing noted. Pedal hair growth normal.  Neurologic Normal speech. Oriented to person, place, and time. Epicritic sensation to  light touch grossly present bilaterally.  Dermatologic Nails well groomed and normal in appearance. No open wounds. No skin lesions.  Orthopedic: Normal joint ROM without pain or crepitus bilaterally. Hallux abductovalgus deformity present Left 1st MPJ diminished range of motion. Left 1st TMT with gross hypermobility. Right 1st MPJ diminished range of motion  Right 1st TMT with gross hypermobility. Lesser digital contractures absent bilaterally.   Radiographs: None Assessment:   1. Hav (hallux abducto valgus), right   2. Hav (hallux abducto valgus), left    Plan:  Patient was evaluated and treated and all questions answered.  Hallux abductovalgus deformity, right greater than left side -All questions and concerns were discussed with the patient in extensive detail.  I discussed shoe gear modification also discussed padding protecting well conservative care.  She will continue working on conservative care for now.  Also discussed surgical options which include Lapiplasty/Lapidus plate fusion to the right foot as this is the more painful side.  She will think about the procedure and will get back to me.   No follow-ups on file.

## 2022-11-17 ENCOUNTER — Encounter: Payer: Self-pay | Admitting: Internal Medicine

## 2022-11-17 ENCOUNTER — Ambulatory Visit: Payer: Medicaid Other | Admitting: Internal Medicine

## 2022-11-17 ENCOUNTER — Other Ambulatory Visit (HOSPITAL_COMMUNITY)
Admission: RE | Admit: 2022-11-17 | Discharge: 2022-11-17 | Disposition: A | Discharge status: Home / Self Care | Payer: Medicaid Other | Source: Ambulatory Visit | Attending: Internal Medicine | Admitting: Internal Medicine

## 2022-11-17 VITALS — BP 110/80 | HR 62 | Temp 96.6°F | Wt 164.0 lb

## 2022-11-17 DIAGNOSIS — N898 Other specified noninflammatory disorders of vagina: Secondary | ICD-10-CM | POA: Insufficient documentation

## 2022-11-17 DIAGNOSIS — Z1211 Encounter for screening for malignant neoplasm of colon: Secondary | ICD-10-CM | POA: Diagnosis not present

## 2022-11-17 NOTE — Progress Notes (Signed)
Subjective:    Patient ID: Jasmine Pruitt, female    DOB: Apr 12, 1976, 47 y.o.   MRN: [id number]  HPI  Pt presents to the clinic today with complaint of vaginal discharge.  She reports this has been an ongoing issue.  The discharge is clear without odor.  She denies pelvic cramping, urinary urgency, frequency, dysuria or blood in her urine.  She denies vaginal irritation, itching, abnormal uterine bleeding.  She is not concerned about STDs.  She was treated for BV 06/2022 with MetroGel and she does not feel like her symptoms improved at that time.  Review of Systems     Past Medical History:  Diagnosis Date   Anemia    Iron deficiency   Heart murmur    as a child - resolved   Hypertension     Current Outpatient Medications  Medication Sig Dispense Refill   hydrochlorothiazide (HYDRODIURIL) 25 MG tablet Take 1 tablet (25 mg total) by mouth daily. 90 tablet 3   medroxyPROGESTERone (DEPO-PROVERA) 150 MG/ML injection Inject 1 mL (150 mg total) into the muscle every 3 (three) months. 1 mL 1   Multiple Vitamin (MULTIVITAMIN) tablet Take 1 tablet by mouth daily.     metroNIDAZOLE (METROGEL) 0.75 % gel Apply 1 Application topically 2 (two) times daily. (Patient not taking: Reported on 11/17/2022) 45 g 0   No current facility-administered medications for this visit.    Allergies  Allergen Reactions   Sulfa Antibiotics Rash and Other (See Comments)    Other reaction(s): Unknown    Family History  Problem Relation Age of Onset   Diabetes Mother    Schizophrenia Mother    Hypertension Father    Heart attack Father    Healthy Sister    Healthy Brother    Breast cancer Maternal Aunt    Healthy Sister    Healthy Brother    Stroke Neg Hx    Colon cancer Neg Hx    Ovarian cancer Neg Hx     Social History   Socioeconomic History   Marital status: Single    Spouse name: Not on file   Number of children: Not on file   Years of education: Not on file   Highest education level: Not  on file  Occupational History   Not on file  Tobacco Use   Smoking status: Never   Smokeless tobacco: Never  Vaping Use   Vaping Use: Never used  Substance and Sexual Activity   Alcohol use: Not Currently    Comment: occasionally   Drug use: No    Comment: occasional marijuana 1-2 x per month   Sexual activity: Yes    Birth control/protection: Injection    Comment: Depo - on and off since 47 years old  Other Topics Concern   Not on file  Social History Narrative   Not on file   Social Determinants of Health   Financial Resource Strain: Not on file  Food Insecurity: Not on file  Transportation Needs: Not on file  Physical Activity: Not on file  Stress: Not on file  Social Connections: Not on file  Intimate Partner Violence: Not on file     Constitutional: Denies fever, malaise, fatigue, headache or abrupt weight changes.  Respiratory: Denies difficulty breathing, shortness of breath, cough or sputum production.   Cardiovascular: Denies chest pain, chest tightness, palpitations or swelling in the hands or feet.  Gastrointestinal: Denies abdominal pain, bloating, constipation, diarrhea or blood in the stool.  GU: Patient reports  vaginal discharge.  Denies urgency, frequency, pain with urination, burning sensation, blood in urine, odor.  No other specific complaints in a complete review of systems (except as listed in HPI above).  Objective:   Physical Exam    BP 110/80 (BP Location: Right Arm, Patient Position: Sitting, Cuff Size: Normal)   Pulse 62   Temp (!) 96.6 F (35.9 C) (Temporal)   Wt 164 lb (74.4 kg)   SpO2 99%   BMI 24.22 kg/m  Wt Readings from Last 3 Encounters:  11/17/22 164 lb (74.4 kg)  07/17/22 145 lb (65.8 kg)  07/01/22 151 lb (68.5 kg)    General: Appears her stated age, well developed, well nourished in NAD. Cardiovascular: Normal rate. Pulmonary/Chest: Normal effort. Abdomen: Soft and nontender.  Neurological: Alert and oriented.      BMET    Component Value Date/Time   NA 141 11/26/2021 0901   K 3.7 11/26/2021 0901   CL 106 11/26/2021 0901   CO2 30 11/26/2021 0901   GLUCOSE 64 (L) 11/26/2021 0901   BUN 8 11/26/2021 0901   CREATININE 0.91 11/26/2021 0901   CALCIUM 8.9 11/26/2021 0901   GFRNONAA 79 05/24/2019 0900   GFRAA 91 05/24/2019 0900    Lipid Panel     Component Value Date/Time   CHOL 139 11/26/2021 0901   TRIG 60 11/26/2021 0901   HDL 60 11/26/2021 0901   CHOLHDL 2.3 11/26/2021 0901   VLDL 12 03/02/2017 0834   LDLCALC 65 11/26/2021 0901    CBC    Component Value Date/Time   WBC 8.1 02/13/2022 1028   RBC 4.62 02/13/2022 1028   HGB 13.1 02/13/2022 1028   HCT 40.8 02/13/2022 1028   PLT 319 02/13/2022 1028   MCV 88.3 02/13/2022 1028   MCH 28.4 02/13/2022 1028   MCHC 32.1 02/13/2022 1028   RDW 12.4 02/13/2022 1028   LYMPHSABS 1,396 11/22/2020 0859   MONOABS 0.3 09/07/2016 1839   EOSABS 8 (L) 11/22/2020 0859   BASOSABS 31 11/22/2020 0859    Hgb A1C No results found for: "HGBA1C"        Assessment & Plan:   Vaginal Discharge:  Will obtain wet prep today If BV positive, she would prefer oral Flagyl as opposed to MetroGel She declines STD screening today  Schedule an appointment for annual exam Webb Silversmith, NP

## 2022-11-17 NOTE — Patient Instructions (Signed)

## 2022-11-19 ENCOUNTER — Inpatient Hospital Stay: Admission: RE | Admit: 2022-11-19 | Payer: Medicaid Other | Source: Ambulatory Visit

## 2022-11-19 LAB — CERVICOVAGINAL ANCILLARY ONLY
Bacterial Vaginitis (gardnerella): POSITIVE — AB
Candida Glabrata: NEGATIVE
Candida Vaginitis: NEGATIVE
Comment: NEGATIVE
Comment: NEGATIVE
Comment: NEGATIVE

## 2022-11-20 MED ORDER — METRONIDAZOLE 500 MG PO TABS: 500.0000 mg | ORAL_TABLET | Freq: Two times a day (BID) | ORAL | 0 refills | Status: DC

## 2022-11-20 NOTE — Addendum Note (Signed)
Addended by: Jearld Fenton on: 11/20/2022 09:39 AM   Modules accepted: Orders

## 2022-12-08 ENCOUNTER — Ambulatory Visit (INDEPENDENT_AMBULATORY_CARE_PROVIDER_SITE_OTHER): Payer: Medicaid Other | Admitting: Internal Medicine

## 2022-12-08 ENCOUNTER — Encounter: Payer: Self-pay | Admitting: Internal Medicine

## 2022-12-08 VITALS — BP 122/68 | HR 69 | Temp 96.8°F | Ht 69.0 in | Wt 162.0 lb

## 2022-12-08 DIAGNOSIS — Z1211 Encounter for screening for malignant neoplasm of colon: Secondary | ICD-10-CM | POA: Diagnosis not present

## 2022-12-08 DIAGNOSIS — Z0001 Encounter for general adult medical examination with abnormal findings: Secondary | ICD-10-CM

## 2022-12-08 DIAGNOSIS — R7309 Other abnormal glucose: Secondary | ICD-10-CM

## 2022-12-08 DIAGNOSIS — R739 Hyperglycemia, unspecified: Secondary | ICD-10-CM

## 2022-12-08 DIAGNOSIS — I10 Essential (primary) hypertension: Secondary | ICD-10-CM

## 2022-12-08 NOTE — Progress Notes (Signed)
Subjective:    Patient ID: Jasmine Pruitt, female    DOB: 1975/10/16, 47 y.o.   MRN: [id number]  HPI  Patient presents to clinic today for her annual exam.  Flu: never Tetanus: unsure COVID: never Pap smear: 09/2019 Mammogram: never Colon screening: never Vision screening: annually Dentist: biannually  Diet: She does eat meat. She consumes fruits and veggies. She does eat fried foods. She drinks mostly water, juice. Exercise: body weight exercise  Review of Systems     Past Medical History:  Diagnosis Date   Anemia    Iron deficiency   Heart murmur    as a child - resolved   Hypertension     Current Outpatient Medications  Medication Sig Dispense Refill   hydrochlorothiazide (HYDRODIURIL) 25 MG tablet Take 1 tablet (25 mg total) by mouth daily. 90 tablet 3   medroxyPROGESTERone (DEPO-PROVERA) 150 MG/ML injection Inject 1 mL (150 mg total) into the muscle every 3 (three) months. 1 mL 1   metroNIDAZOLE (FLAGYL) 500 MG tablet Take 1 tablet (500 mg total) by mouth 2 (two) times daily. Do not drink alcohol while taking this medicine. 14 tablet 0   Multiple Vitamin (MULTIVITAMIN) tablet Take 1 tablet by mouth daily.     No current facility-administered medications for this visit.    Allergies  Allergen Reactions   Sulfa Antibiotics Rash and Other (See Comments)    Other reaction(s): Unknown    Family History  Problem Relation Age of Onset   Diabetes Mother    Schizophrenia Mother    Hypertension Father    Heart attack Father    Healthy Sister    Healthy Brother    Breast cancer Maternal Aunt    Healthy Sister    Healthy Brother    Stroke Neg Hx    Colon cancer Neg Hx    Ovarian cancer Neg Hx     Social History   Socioeconomic History   Marital status: Single    Spouse name: Not on file   Number of children: Not on file   Years of education: Not on file   Highest education level: Not on file  Occupational History   Not on file  Tobacco Use   Smoking  status: Never   Smokeless tobacco: Never  Vaping Use   Vaping Use: Never used  Substance and Sexual Activity   Alcohol use: Not Currently    Comment: occasionally   Drug use: No    Comment: occasional marijuana 1-2 x per month   Sexual activity: Yes    Birth control/protection: Injection    Comment: Depo - on and off since 47 years old  Other Topics Concern   Not on file  Social History Narrative   Not on file   Social Determinants of Health   Financial Resource Strain: Not on file  Food Insecurity: Not on file  Transportation Needs: Not on file  Physical Activity: Not on file  Stress: Not on file  Social Connections: Not on file  Intimate Partner Violence: Not on file     Constitutional: Denies fever, malaise, fatigue, headache or abrupt weight changes.  HEENT: Denies eye pain, eye redness, ear pain, ringing in the ears, wax buildup, runny nose, nasal congestion, bloody nose, or sore throat. Respiratory: Denies difficulty breathing, shortness of breath, cough or sputum production.   Cardiovascular: Denies chest pain, chest tightness, palpitations or swelling in the hands or feet.  Gastrointestinal: Denies abdominal pain, bloating, constipation, diarrhea or blood in the  stool.  GU: Denies urgency, frequency, pain with urination, burning sensation, blood in urine, odor or discharge. Musculoskeletal: Denies decrease in range of motion, difficulty with gait, muscle pain or joint pain and swelling.  Skin: Denies redness, rashes, lesions or ulcercations.  Neurological: Denies dizziness, difficulty with memory, difficulty with speech or problems with balance and coordination.  Psych: Denies anxiety, depression, SI/HI.  No other specific complaints in a complete review of systems (except as listed in HPI above).  Objective:   Physical Exam   BP 122/68 (BP Location: Left Arm, Patient Position: Sitting, Cuff Size: Normal)   Pulse 69   Temp (!) 96.8 F (36 C) (Temporal)   Ht  5\' 9"  (1.753 m)   Wt 162 lb (73.5 kg)   SpO2 100%   BMI 23.92 kg/m   Wt Readings from Last 3 Encounters:  11/17/22 164 lb (74.4 kg)  07/17/22 145 lb (65.8 kg)  07/01/22 151 lb (68.5 kg)    General: Appears her stated age, well developed, well nourished in NAD. Skin: Warm, dry and intact.  HEENT: Head: normal shape and size; Eyes: sclera white, no icterus, conjunctiva pink, PERRLA and EOMs intact;  Neck:  Neck supple, trachea midline. No masses, lumps or thyromegaly present.  Cardiovascular: Normal rate and rhythm. S1,S2 noted.  No murmur, rubs or gallops noted. No JVD or BLE edema. Pulmonary/Chest: Normal effort and positive vesicular breath sounds. No respiratory distress. No wheezes, rales or ronchi noted.  Abdomen: Normal bowel sounds. Musculoskeletal: Strength 5/5 BUE/BLE. No difficulty with gait.  Neurological: Alert and oriented. Cranial nerves II-XII grossly intact. Coordination normal.  Psychiatric: Mood and affect normal. Behavior is normal. Judgment and thought content normal.     BMET    Component Value Date/Time   NA 141 11/26/2021 0901   K 3.7 11/26/2021 0901   CL 106 11/26/2021 0901   CO2 30 11/26/2021 0901   GLUCOSE 64 (L) 11/26/2021 0901   BUN 8 11/26/2021 0901   CREATININE 0.91 11/26/2021 0901   CALCIUM 8.9 11/26/2021 0901   GFRNONAA 79 05/24/2019 0900   GFRAA 91 05/24/2019 0900    Lipid Panel     Component Value Date/Time   CHOL 139 11/26/2021 0901   TRIG 60 11/26/2021 0901   HDL 60 11/26/2021 0901   CHOLHDL 2.3 11/26/2021 0901   VLDL 12 03/02/2017 0834   LDLCALC 65 11/26/2021 0901    CBC    Component Value Date/Time   WBC 8.1 02/13/2022 1028   RBC 4.62 02/13/2022 1028   HGB 13.1 02/13/2022 1028   HCT 40.8 02/13/2022 1028   PLT 319 02/13/2022 1028   MCV 88.3 02/13/2022 1028   MCH 28.4 02/13/2022 1028   MCHC 32.1 02/13/2022 1028   RDW 12.4 02/13/2022 1028   LYMPHSABS 1,396 11/22/2020 0859   MONOABS 0.3 09/07/2016 1839   EOSABS 8 (L)  11/22/2020 0859   BASOSABS 31 11/22/2020 0859    Hgb A1C No results found for: "HGBA1C"         Assessment & Plan:   Preventative Health Maintenance:  Encouraged her to get a flu shot in the fall She declines Tetanus Encouraged her to her COVID-vaccine Pap smear UTD Mammogram ordered, she no showed her appointment and needs to reschedule this Cologuard has been ordered, advised her to complete this Encouraged her to consume a balanced diet and exercise regimen Advised her to see an eye doctor and dentist annually We will check CBC, c-Met, lipid, A1c today  RTC in  1 year, sooner if needed Webb Silversmith, NP

## 2022-12-08 NOTE — Patient Instructions (Signed)

## 2022-12-08 NOTE — Assessment & Plan Note (Signed)
Controlled on HCTZ Reinforced DASH diet  

## 2022-12-09 LAB — COMPLETE METABOLIC PANEL WITH GFR
AG Ratio: 1.6 (calc) (ref 1.0–2.5)
ALT: 36 U/L — ABNORMAL HIGH (ref 6–29)
AST: 28 U/L (ref 10–35)
Albumin: 4.4 g/dL (ref 3.6–5.1)
Alkaline phosphatase (APISO): 50 U/L (ref 31–125)
BUN: 16 mg/dL (ref 7–25)
CO2: 32 mmol/L (ref 20–32)
Calcium: 10.1 mg/dL (ref 8.6–10.2)
Chloride: 97 mmol/L — ABNORMAL LOW (ref 98–110)
Creat: 0.98 mg/dL (ref 0.50–0.99)
Globulin: 2.7 g/dL (calc) (ref 1.9–3.7)
Glucose, Bld: 78 mg/dL (ref 65–139)
Potassium: 3.8 mmol/L (ref 3.5–5.3)
Sodium: 139 mmol/L (ref 135–146)
Total Bilirubin: 0.3 mg/dL (ref 0.2–1.2)
Total Protein: 7.1 g/dL (ref 6.1–8.1)
eGFR: 73 mL/min/{1.73_m2} (ref >=60)

## 2022-12-09 LAB — HEMOGLOBIN A1C
Hgb A1c MFr Bld: 5.4 % of total Hgb (ref <5.7)
Mean Plasma Glucose: 108 mg/dL
eAG (mmol/L): 6 mmol/L

## 2022-12-09 LAB — CBC
HCT: 41 % (ref 35.0–45.0)
Hemoglobin: 13.6 g/dL (ref 11.7–15.5)
MCH: 28.2 pg (ref 27.0–33.0)
MCHC: 33.2 g/dL (ref 32.0–36.0)
MCV: 85.1 fL (ref 80.0–100.0)
MPV: 11.8 fL (ref 7.5–12.5)
Platelets: 345 10*3/uL (ref 140–400)
RBC: 4.82 10*6/uL (ref 3.80–5.10)
RDW: 12.6 % (ref 11.0–15.0)
WBC: 9 10*3/uL (ref 3.8–10.8)

## 2022-12-09 LAB — LIPID PANEL
Cholesterol: 212 mg/dL — ABNORMAL HIGH (ref <200)
HDL: 70 mg/dL (ref >=50)
LDL Cholesterol (Calc): 105 mg/dL (calc) — ABNORMAL HIGH
Non-HDL Cholesterol (Calc): 142 mg/dL (calc) — ABNORMAL HIGH (ref <130)
Total CHOL/HDL Ratio: 3 (calc) (ref <5.0)
Triglycerides: 247 mg/dL — ABNORMAL HIGH (ref <150)

## 2022-12-23 ENCOUNTER — Encounter: Payer: Medicaid Other | Admitting: Internal Medicine

## 2023-06-12 ENCOUNTER — Other Ambulatory Visit: Payer: Self-pay | Admitting: Internal Medicine

## 2023-06-12 DIAGNOSIS — I10 Essential (primary) hypertension: Secondary | ICD-10-CM

## 2023-06-12 NOTE — Telephone Encounter (Signed)
Per note for OV of 12/08/2022 - pt is to RTC in 1 year. Requested Prescriptions  Pending Prescriptions Disp Refills   hydrochlorothiazide (HYDRODIURIL) 25 MG tablet [Pharmacy Med Name: HYDROCHLOROTHIAZIDE 25MG  TABLETS] 90 tablet 1    Sig: TAKE 1 TABLET(25 MG) BY MOUTH DAILY     Cardiovascular: Diuretics - Thiazide Failed - 06/12/2023  3:22 AM      Failed - Cr in normal range and within 180 days    Creat  Date Value Ref Range Status  12/08/2022 0.98 0.50 - 0.99 mg/dL Final   Creatinine, Urine  Date Value Ref Range Status  08/28/2016 25 mg/dL Final         Failed - K in normal range and within 180 days    Potassium  Date Value Ref Range Status  12/08/2022 3.8 3.5 - 5.3 mmol/L Final         Failed - Na in normal range and within 180 days    Sodium  Date Value Ref Range Status  12/08/2022 139 135 - 146 mmol/L Final         Failed - Valid encounter within last 6 months    Recent Outpatient Visits           6 months ago Encounter for general adult medical examination with abnormal findings   [place] [facility] [place], Salvadore [place], NP   6 months ago Vaginal discharge   [place] [facility] [place], Salvadore Oxford, NP   11 months ago Bilateral bunions   [place] [place], Salvadore Oxford, NP   11 months ago Vaginal discharge   [place] [facility], Salvadore Oxford, NP   1 year ago Irregular periods   [facility] [place], [place], NP              Passed - Last BP in normal range    BP Readings from Last 1 Encounters:  12/08/22 122/68

## 2023-08-03 ENCOUNTER — Other Ambulatory Visit: Payer: Self-pay | Admitting: Internal Medicine

## 2023-08-03 DIAGNOSIS — I10 Essential (primary) hypertension: Secondary | ICD-10-CM

## 2023-08-03 NOTE — Telephone Encounter (Signed)
Medication Refill -  Most Recent Primary Care Visit:  Provider: Lorre Munroe  Department: [facility]  Visit Type: PHYSICAL 20  Date: 12/08/2022  Medication: hydrochlorothiazide (HYDRODIURIL) 25 MG tablet   Has the patient contacted their pharmacy? Yes  (Agent: If yes, when and what did the pharmacy advise?) Contact practice, patient states she requested refills from pharmacy   Is this the correct pharmacy for this prescription? Yes  This is the patient's preferred pharmacy:  [id number] - [place], [place] - [address] AT [facility] OF [address] [place] [address] [place] [address] Phone: [phone] Fax: [phone]  Has the prescription been filled recently? No  Is the patient out of the medication? Yes  Has the patient been seen for an appointment in the last year OR does the patient have an upcoming appointment? Yes  Can we respond through MyChart? No Best contact number: [phone]  Agent: Please be advised that Rx refills may take up to 3 business days. We ask that you follow-up with your pharmacy.

## 2023-08-04 MED ORDER — HYDROCHLOROTHIAZIDE 25 MG PO TABS: 25.0000 mg | ORAL_TABLET | Freq: Every day | ORAL | 0 refills | Status: DC

## 2023-08-04 NOTE — Telephone Encounter (Signed)
Attempted to call patient to schedule 6 month f/u appointment-no answer Left message to call office.  Courtesy 30 day Rx sent to pharmacy.  Requested Prescriptions  Pending Prescriptions Disp Refills   hydrochlorothiazide (HYDRODIURIL) 25 MG tablet 30 tablet 0    Sig: TAKE 1 TABLET(25 MG) BY MOUTH DAILY     Cardiovascular: Diuretics - Thiazide Failed - 08/03/2023  1:11 PM      Failed - Cr in normal range and within 180 days    Creat  Date Value Ref Range Status  12/08/2022 0.98 0.50 - 0.99 mg/dL Final   Creatinine, Urine  Date Value Ref Range Status  08/28/2016 25 mg/dL Final         Failed - K in normal range and within 180 days    Potassium  Date Value Ref Range Status  12/08/2022 3.8 3.5 - 5.3 mmol/L Final         Failed - Na in normal range and within 180 days    Sodium  Date Value Ref Range Status  12/08/2022 139 135 - 146 mmol/L Final         Failed - Valid encounter within last 6 months    Recent Outpatient Visits           7 months ago Encounter for general adult medical examination with abnormal findings   [place] [facility] [place], Salvadore Oxford, NP   8 months ago Vaginal discharge   [place] [facility] [place], Salvadore Oxford, NP   1 year ago Bilateral bunions   [place] [facility] [place], Salvadore Oxford, NP   1 year ago Vaginal discharge   [place] [facility] [place], Salvadore Oxford, NP   1 year ago Irregular periods   [place] [facility] [place], [place], NP              Passed - Last BP in normal range    BP Readings from Last 1 Encounters:  12/08/22 122/68

## 2023-08-25 ENCOUNTER — Other Ambulatory Visit: Payer: Self-pay

## 2023-08-25 ENCOUNTER — Emergency Department
Admission: EM | Admit: 2023-08-25 | Discharge: 2023-08-25 | Disposition: A | Discharge status: Home / Self Care | Payer: No Typology Code available for payment source | Attending: Emergency Medicine | Admitting: Emergency Medicine

## 2023-08-25 ENCOUNTER — Emergency Department: Payer: No Typology Code available for payment source

## 2023-08-25 ENCOUNTER — Encounter: Payer: Self-pay | Admitting: Emergency Medicine

## 2023-08-25 DIAGNOSIS — Y92410 Unspecified street and highway as the place of occurrence of the external cause: Secondary | ICD-10-CM | POA: Diagnosis not present

## 2023-08-25 DIAGNOSIS — S6392XA Sprain of unspecified part of left wrist and hand, initial encounter: Secondary | ICD-10-CM | POA: Diagnosis not present

## 2023-08-25 DIAGNOSIS — S46912A Strain of unspecified muscle, fascia and tendon at shoulder and upper arm level, left arm, initial encounter: Secondary | ICD-10-CM | POA: Insufficient documentation

## 2023-08-25 DIAGNOSIS — I10 Essential (primary) hypertension: Secondary | ICD-10-CM | POA: Insufficient documentation

## 2023-08-25 DIAGNOSIS — S4992XA Unspecified injury of left shoulder and upper arm, initial encounter: Secondary | ICD-10-CM | POA: Diagnosis present

## 2023-08-25 MED ORDER — BACLOFEN 10 MG PO TABS: 10.0000 mg | ORAL_TABLET | Freq: Three times a day (TID) | ORAL | 0 refills | Status: AC
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
## 2023-09-01 ENCOUNTER — Other Ambulatory Visit: Payer: Self-pay | Admitting: Internal Medicine

## 2023-09-01 DIAGNOSIS — I10 Essential (primary) hypertension: Secondary | ICD-10-CM

## 2023-09-01 NOTE — Telephone Encounter (Signed)
Patient must keep upcoming appointment for further refills. Requested Prescriptions  Pending Prescriptions Disp Refills   hydrochlorothiazide (HYDRODIURIL) 25 MG tablet [Pharmacy Med Name: HYDROCHLOROTHIAZIDE 25MG  TABLETS] 90 tablet 0    Sig: TAKE 1 TABLET(25 MG) BY MOUTH DAILY     Cardiovascular: Diuretics - Thiazide Failed - 09/01/2023  2:25 PM      Failed - Cr in normal range and within 180 days    Creat  Date Value Ref Range Status  12/08/2022 0.98 0.50 - 0.99 mg/dL Final   Creatinine, Urine  Date Value Ref Range Status  08/28/2016 25 mg/dL Final         Failed - K in normal range and within 180 days    Potassium  Date Value Ref Range Status  12/08/2022 3.8 3.5 - 5.3 mmol/L Final         Failed - Na in normal range and within 180 days    Sodium  Date Value Ref Range Status  12/08/2022 139 135 - 146 mmol/L Final         Failed - Last BP in normal range    BP Readings from Last 1 Encounters:  08/25/23 (!) 165/94         Failed - Valid encounter within last 6 months    Recent Outpatient Visits           8 months ago Encounter for general adult medical examination with abnormal findings   [place] [facility] [place], Salvadore Oxford, NP   9 months ago Vaginal discharge   [place] [facility] [place], Salvadore Oxford, NP   1 year ago Bilateral bunions   [place] [facility] [place], Salvadore Oxford, NP   1 year ago Vaginal discharge   [place] [facility] [place], Salvadore Oxford, NP   1 year ago Irregular periods   [facility] [place], Salvadore Oxford, NP       Future Appointments             Tomorrow [place], Salvadore Oxford, NP [facility]   In 1 week [place], Salvadore Oxford, NP  [facility]

## 2023-09-02 ENCOUNTER — Ambulatory Visit: Payer: MEDICAID | Admitting: Internal Medicine

## 2023-09-02 ENCOUNTER — Encounter: Payer: Self-pay | Admitting: Internal Medicine

## 2023-09-02 DIAGNOSIS — I10 Essential (primary) hypertension: Secondary | ICD-10-CM

## 2023-09-02 DIAGNOSIS — S46912D Strain of unspecified muscle, fascia and tendon at shoulder and upper arm level, left arm, subsequent encounter: Secondary | ICD-10-CM | POA: Diagnosis not present

## 2023-09-02 DIAGNOSIS — S161XXD Strain of muscle, fascia and tendon at neck level, subsequent encounter: Secondary | ICD-10-CM | POA: Diagnosis not present

## 2023-09-02 MED ORDER — HYDROCHLOROTHIAZIDE 25 MG PO TABS: 25.0000 mg | ORAL_TABLET | Freq: Every day | ORAL | 0 refills | Status: DC

## 2023-09-02 NOTE — Patient Instructions (Signed)
Neck Exercises Ask your health care provider which exercises are safe for you. Do exercises exactly as told by your health care provider and adjust them as directed. It is normal to feel mild stretching, pulling, tightness, or discomfort as you do these exercises. Stop right away if you feel sudden pain or your pain gets worse. Do not begin these exercises until told by your health care provider. Neck exercises can be important for many reasons. They can improve strength and maintain flexibility in your neck, which will help your upper back and prevent neck pain. Stretching exercises Rotation neck stretching  Sit in a chair or stand up. Place your feet flat on the floor, shoulder-width apart. Slowly turn your head (rotate) to the right until a slight stretch is felt. Turn it all the way to the right so you can look over your right shoulder. Do not tilt or tip your head. Hold this position for 10-30 seconds. Slowly turn your head (rotate) to the left until a slight stretch is felt. Turn it all the way to the left so you can look over your left shoulder. Do not tilt or tip your head. Hold this position for 10-30 seconds. Repeat __________ times. Complete this exercise __________ times a day. Neck retraction  Sit in a sturdy chair or stand up. Look straight ahead. Do not bend your neck. Use your fingers to push your chin backward (retraction). Do not bend your neck for this movement. Continue to face straight ahead. If you are doing the exercise properly, you will feel a slight sensation in your throat and a stretch at the back of your neck. Hold the stretch for 1-2 seconds. Repeat __________ times. Complete this exercise __________ times a day. Strengthening exercises Neck press  Lie on your back on a firm bed or on the floor with a pillow under your head. Use your neck muscles to push your head down on the pillow and straighten your spine. Hold the position as well as you can. Keep your head  facing up (in a neutral position) and your chin tucked. Slowly count to 5 while holding this position. Repeat __________ times. Complete this exercise __________ times a day. Isometrics These are exercises in which you strengthen the muscles in your neck while keeping your neck still (isometrics). Sit in a supportive chair and place your hand on your forehead. Keep your head and face facing straight ahead. Do not flex or extend your neck while doing isometrics. Push forward with your head and neck while pushing back with your hand. Hold for 10 seconds. Do the sequence again, this time putting your hand against the back of your head. Use your head and neck to push backward against the hand pressure. Finally, do the same exercise on either side of your head, pushing sideways against the pressure of your hand. Repeat __________ times. Complete this exercise __________ times a day. Prone head lifts  Lie face-down (prone position), resting on your elbows so that your chest and upper back are raised. Start with your head facing downward, near your chest. Position your chin either on or near your chest. Slowly lift your head upward. Lift until you are looking straight ahead. Then continue lifting your head as far back as you can comfortably stretch. Hold your head up for 5 seconds. Then slowly lower it to your starting position. Repeat __________ times. Complete this exercise __________ times a day. Supine head lifts  Lie on your back (supine position), bending your knees   to point to the ceiling and keeping your feet flat on the floor. Lift your head slowly off the floor, raising your chin toward your chest. Hold for 5 seconds. Repeat __________ times. Complete this exercise __________ times a day. Scapular retraction  Stand with your arms at your sides. Look straight ahead. Slowly pull both shoulders (scapulae) backward and downward (retraction) until you feel a stretch between your shoulder  blades in your upper back. Hold for 10-30 seconds. Relax and repeat. Repeat __________ times. Complete this exercise __________ times a day. Contact a health care provider if: Your neck pain or discomfort gets worse when you do an exercise. Your neck pain or discomfort does not improve within 2 hours after you exercise. If you have any of these problems, stop exercising right away. Do not do the exercises again unless your health care provider says that you can. Get help right away if: You develop sudden, severe neck pain. If this happens, stop exercising right away. Do not do the exercises again unless your health care provider says that you can. This information is not intended to replace advice given to you by your health care provider. Make sure you discuss any questions you have with your health care provider. Document Revised: 02/26/2021 Document Reviewed: 02/26/2021 Elsevier Patient Education  2024 Elsevier Inc.  
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
## 2023-09-02 NOTE — Assessment & Plan Note (Signed)
HCTZ refilled today Reinforced DASH diet

## 2023-09-11 ENCOUNTER — Ambulatory Visit: Payer: Medicaid Other | Admitting: Internal Medicine

## 2023-12-11 ENCOUNTER — Other Ambulatory Visit (HOSPITAL_COMMUNITY)
Admission: RE | Admit: 2023-12-11 | Discharge: 2023-12-11 | Disposition: A | Discharge status: Home / Self Care | Payer: MEDICAID | Source: Ambulatory Visit | Attending: Internal Medicine | Admitting: Internal Medicine

## 2023-12-11 ENCOUNTER — Encounter: Payer: Self-pay | Admitting: Internal Medicine

## 2023-12-11 ENCOUNTER — Ambulatory Visit (INDEPENDENT_AMBULATORY_CARE_PROVIDER_SITE_OTHER): Payer: MEDICAID | Admitting: Internal Medicine

## 2023-12-11 VITALS — BP 108/68 | Ht 69.0 in | Wt 171.2 lb

## 2023-12-11 DIAGNOSIS — Z7251 High risk heterosexual behavior: Secondary | ICD-10-CM

## 2023-12-11 DIAGNOSIS — Z1231 Encounter for screening mammogram for malignant neoplasm of breast: Secondary | ICD-10-CM | POA: Diagnosis not present

## 2023-12-11 DIAGNOSIS — E663 Overweight: Secondary | ICD-10-CM

## 2023-12-11 DIAGNOSIS — N898 Other specified noninflammatory disorders of vagina: Secondary | ICD-10-CM

## 2023-12-11 DIAGNOSIS — Z0001 Encounter for general adult medical examination with abnormal findings: Secondary | ICD-10-CM

## 2023-12-11 DIAGNOSIS — Z1211 Encounter for screening for malignant neoplasm of colon: Secondary | ICD-10-CM | POA: Diagnosis not present

## 2023-12-11 DIAGNOSIS — R739 Hyperglycemia, unspecified: Secondary | ICD-10-CM

## 2023-12-11 DIAGNOSIS — Z6825 Body mass index (BMI) 25.0-25.9, adult: Secondary | ICD-10-CM

## 2023-12-11 DIAGNOSIS — Z136 Encounter for screening for cardiovascular disorders: Secondary | ICD-10-CM

## 2023-12-11 DIAGNOSIS — I10 Essential (primary) hypertension: Secondary | ICD-10-CM

## 2023-12-11 MED ORDER — HYDROCHLOROTHIAZIDE 25 MG PO TABS: 25.0000 mg | ORAL_TABLET | Freq: Every day | ORAL | 3 refills | Status: AC

## 2023-12-11 NOTE — Patient Instructions (Signed)

## 2023-12-11 NOTE — Progress Notes (Signed)
 Subjective:    Patient ID: Jasmine Pruitt, female    DOB: 1976-05-09, 48 y.o.   MRN: [id number]  HPI  Patient presents to clinic today for her annual exam.  Flu: never Tetanus: unsure COVID: never Pap smear: 09/2019 Mammogram: never Colon screening: never Vision screening: annually Dentist: biannually  Diet: She does eat meat. She consumes fruits and veggies. She does eat fried foods. She drinks mostly water, juice. Exercise: body weight exercise  Review of Systems     Past Medical History:  Diagnosis Date   Anemia    Iron deficiency   Heart murmur    as a child - resolved   Hypertension     Current Outpatient Medications  Medication Sig Dispense Refill   hydrochlorothiazide (HYDRODIURIL) 25 MG tablet Take 1 tablet (25 mg total) by mouth daily. 90 tablet 0   medroxyPROGESTERone (DEPO-PROVERA) 150 MG/ML injection Inject 1 mL (150 mg total) into the muscle every 3 (three) months. 1 mL 1   Multiple Vitamin (MULTIVITAMIN) tablet Take 1 tablet by mouth daily.     No current facility-administered medications for this visit.    Allergies  Allergen Reactions   Sulfa Antibiotics Rash and Other (See Comments)    Other reaction(s): Unknown    Family History  Problem Relation Age of Onset   Diabetes Mother    Schizophrenia Mother    Hypertension Father    Heart attack Father    Healthy Sister    Healthy Brother    Breast cancer Maternal Aunt    Healthy Sister    Healthy Brother    Stroke Neg Hx    Colon cancer Neg Hx    Ovarian cancer Neg Hx     Social History   Socioeconomic History   Marital status: Single    Spouse name: Not on file   Number of children: Not on file   Years of education: Not on file   Highest education level: Not on file  Occupational History   Not on file  Tobacco Use   Smoking status: Never   Smokeless tobacco: Never  Vaping Use   Vaping status: Never Used  Substance and Sexual Activity   Alcohol use: Not Currently    Comment:  occasionally   Drug use: No    Comment: occasional marijuana 1-2 x per month   Sexual activity: Yes    Birth control/protection: Injection    Comment: Depo - on and off since 48 years old  Other Topics Concern   Not on file  Social History Narrative   Not on file   Social Drivers of Health   Financial Resource Strain: Not on file  Food Insecurity: Not on file  Transportation Needs: Not on file  Physical Activity: Not on file  Stress: Not on file  Social Connections: Not on file  Intimate Partner Violence: Not on file     Constitutional: Denies fever, malaise, fatigue, headache or abrupt weight changes.  HEENT: Denies eye pain, eye redness, ear pain, ringing in the ears, wax buildup, runny nose, nasal congestion, bloody nose, or sore throat. Respiratory: Denies difficulty breathing, shortness of breath, cough or sputum production.   Cardiovascular: Denies chest pain, chest tightness, palpitations or swelling in the hands or feet.  Gastrointestinal: Denies abdominal pain, bloating, constipation, diarrhea or blood in the stool.  GU: Pt reports vaginal discharge. Denies urgency, frequency, pain with urination, burning sensation, blood in urine, odor. Musculoskeletal: Denies decrease in range of motion, difficulty with gait, muscle  pain or joint pain and swelling.  Skin: Denies redness, rashes, lesions or ulcercations.  Neurological: Denies dizziness, difficulty with memory, difficulty with speech or problems with balance and coordination.  Psych: Denies anxiety, depression, SI/HI.  No other specific complaints in a complete review of systems (except as listed in HPI above).  Objective:   Physical Exam  BP 108/68 (BP Location: Left Arm, Patient Position: Sitting, Cuff Size: Normal)   Ht 5\' 9"  (1.753 m)   Wt 171 lb 3.2 oz (77.7 kg)   LMP 12/04/2023 (Approximate)   BMI 25.28 kg/m    Wt Readings from Last 3 Encounters:  09/02/23 166 lb (75.3 kg)  08/25/23 160 lb (72.6 kg)   12/08/22 162 lb (73.5 kg)    General: Appears her stated age, well developed, well nourished in NAD. Skin: Warm, dry and intact.  HEENT: Head: normal shape and size; Eyes: sclera white, no icterus, conjunctiva pink, PERRLA and EOMs intact;  Neck:  Neck supple, trachea midline. No masses, lumps or thyromegaly present.  Cardiovascular: Normal rate and rhythm. S1,S2 noted.  No murmur, rubs or gallops noted. No JVD or BLE edema. Pulmonary/Chest: Normal effort and positive vesicular breath sounds. No respiratory distress. No wheezes, rales or ronchi noted.  Abdomen: Normal bowel sounds. Musculoskeletal: Strength 5/5 BUE/BLE. No difficulty with gait.  Neurological: Alert and oriented. Cranial nerves II-XII grossly intact. Coordination normal.  Psychiatric: Mood and affect normal. Behavior is normal. Judgment and thought content normal.     BMET    Component Value Date/Time   NA 139 12/08/2022 1529   K 3.8 12/08/2022 1529   CL 97 (L) 12/08/2022 1529   CO2 32 12/08/2022 1529   GLUCOSE 78 12/08/2022 1529   BUN 16 12/08/2022 1529   CREATININE 0.98 12/08/2022 1529   CALCIUM 10.1 12/08/2022 1529   GFRNONAA 79 05/24/2019 0900   GFRAA 91 05/24/2019 0900    Lipid Panel     Component Value Date/Time   CHOL 212 (H) 12/08/2022 1529   TRIG 247 (H) 12/08/2022 1529   HDL 70 12/08/2022 1529   CHOLHDL 3.0 12/08/2022 1529   VLDL 12 03/02/2017 0834   LDLCALC 105 (H) 12/08/2022 1529    CBC    Component Value Date/Time   WBC 9.0 12/08/2022 1529   RBC 4.82 12/08/2022 1529   HGB 13.6 12/08/2022 1529   HCT 41.0 12/08/2022 1529   PLT 345 12/08/2022 1529   MCV 85.1 12/08/2022 1529   MCH 28.2 12/08/2022 1529   MCHC 33.2 12/08/2022 1529   RDW 12.6 12/08/2022 1529   LYMPHSABS 1,396 11/22/2020 0859   MONOABS 0.3 09/07/2016 1839   EOSABS 8 (L) 11/22/2020 0859   BASOSABS 31 11/22/2020 0859    Hgb A1C Lab Results  Component Value Date   HGBA1C 5.4 12/08/2022           Assessment &  Plan:   Preventative Health Maintenance:  Encouraged her to get a flu shot in the fall She declines Tetanus Encouraged her to her COVID-vaccine Pap smear UTD Mammogram ordered-she will call to schedule Cologuard has been ordered, advised her to complete this Encouraged her to consume a balanced diet and exercise regimen Advised her to see an eye doctor and dentist annually We will check CBC, c-Met, lipid, A1c today  Unprotected sex:  Urine hCG negative  Vaginal discharge:  Will check wet prep for gonorrhea, chlamydia, trichomonas, BV and yeast  RTC in 1 year, sooner if needed Nicki Reaper, NP

## 2023-12-11 NOTE — Assessment & Plan Note (Signed)
 HCTZ refilled today Reinforced DASH diet

## 2023-12-11 NOTE — Assessment & Plan Note (Signed)
 Encouraged diet and exercise for weight loss ?

## 2023-12-12 LAB — CBC
HCT: 38.7 % (ref 35.0–45.0)
Hemoglobin: 12.7 g/dL (ref 11.7–15.5)
MCH: 27.5 pg (ref 27.0–33.0)
MCHC: 32.8 g/dL (ref 32.0–36.0)
MCV: 83.9 fL (ref 80.0–100.0)
MPV: 11.4 fL (ref 7.5–12.5)
Platelets: 358 10*3/uL (ref 140–400)
RBC: 4.61 10*6/uL (ref 3.80–5.10)
RDW: 12.6 % (ref 11.0–15.0)
WBC: 7.6 10*3/uL (ref 3.8–10.8)

## 2023-12-12 LAB — COMPREHENSIVE METABOLIC PANEL WITH GFR
AG Ratio: 1.9 (calc) (ref 1.0–2.5)
ALT: 10 U/L (ref 6–29)
AST: 16 U/L (ref 10–35)
Albumin: 4.2 g/dL (ref 3.6–5.1)
Alkaline phosphatase (APISO): 42 U/L (ref 31–125)
BUN: 9 mg/dL (ref 7–25)
CO2: 33 mmol/L — ABNORMAL HIGH (ref 20–32)
Calcium: 9 mg/dL (ref 8.6–10.2)
Chloride: 100 mmol/L (ref 98–110)
Creat: 0.98 mg/dL (ref 0.50–0.99)
Globulin: 2.2 g/dL (ref 1.9–3.7)
Glucose, Bld: 76 mg/dL (ref 65–99)
Potassium: 3.6 mmol/L (ref 3.5–5.3)
Sodium: 140 mmol/L (ref 135–146)
Total Bilirubin: 0.4 mg/dL (ref 0.2–1.2)
Total Protein: 6.4 g/dL (ref 6.1–8.1)
eGFR: 72 mL/min/{1.73_m2} (ref >=60)

## 2023-12-12 LAB — LIPID PANEL
Cholesterol: 195 mg/dL (ref <200)
HDL: 58 mg/dL (ref >=50)
LDL Cholesterol (Calc): 103 mg/dL — ABNORMAL HIGH
Non-HDL Cholesterol (Calc): 137 mg/dL — ABNORMAL HIGH (ref <130)
Total CHOL/HDL Ratio: 3.4 (calc) (ref <5.0)
Triglycerides: 220 mg/dL — ABNORMAL HIGH (ref <150)

## 2023-12-12 LAB — HEMOGLOBIN A1C
Hgb A1c MFr Bld: 5.6 %{Hb} (ref <5.7)
Mean Plasma Glucose: 114 mg/dL
eAG (mmol/L): 6.3 mmol/L

## 2023-12-12 LAB — HCG, QUANTITATIVE, PREGNANCY: HCG, Total, QN: <5 m[IU]/mL

## 2023-12-15 LAB — CERVICOVAGINAL ANCILLARY ONLY
Bacterial Vaginitis (gardnerella): NEGATIVE
Candida Glabrata: NEGATIVE
Candida Vaginitis: NEGATIVE
Chlamydia: NEGATIVE
Comment: NEGATIVE
Comment: NEGATIVE
Comment: NEGATIVE
Comment: NEGATIVE
Comment: NEGATIVE
Comment: NORMAL
Neisseria Gonorrhea: NEGATIVE
Trichomonas: NEGATIVE

## 2024-01-12 ENCOUNTER — Other Ambulatory Visit (HOSPITAL_COMMUNITY)
Admission: RE | Admit: 2024-01-12 | Discharge: 2024-01-12 | Disposition: A | Discharge status: Home / Self Care | Payer: MEDICAID | Source: Ambulatory Visit | Attending: Internal Medicine | Admitting: Internal Medicine

## 2024-01-12 ENCOUNTER — Ambulatory Visit: Payer: MEDICAID | Admitting: Internal Medicine

## 2024-01-12 ENCOUNTER — Encounter: Payer: Self-pay | Admitting: Internal Medicine

## 2024-01-12 VITALS — BP 128/82 | Ht 69.0 in | Wt 168.2 lb

## 2024-01-12 DIAGNOSIS — N898 Other specified noninflammatory disorders of vagina: Secondary | ICD-10-CM | POA: Insufficient documentation

## 2024-01-12 MED ORDER — FLUCONAZOLE 150 MG PO TABS: 150.0000 mg | ORAL_TABLET | Freq: Once | ORAL | 0 refills | Status: AC

## 2024-01-12 NOTE — Patient Instructions (Signed)
 Irritation of the Vagina (Vaginitis): What to Know  Vaginitis is irritation and swelling of the vagina. It happens when the usual balance of bacteria and yeast in the vagina changes. This change causes some types to grow too much. This overgrowth leads to vaginitis. What are the causes? Bacteria. Yeast, which is a fungus. A parasite. A virus. Low hormones in the body. This can occur during pregnancy, breastfeeding, or after menopause. What increases the risk? Irritants, such as douches, bubble baths, scented tampons, and feminine sprays. Antibiotics. Poor hygiene. Wearing tight pants or thong underwear. Some birth control methods, such as diaphragms, vaginal sponges, or spermicides. Having sex without a condom or having sex with more than one person. Infections. Uncontrolled diabetes. What are the signs or symptoms? Abnormal fluid from the vagina. The fluid may be: White, gray, or yellow. Thick, white, and cheesy. Frothy and yellow or green. A bad smell from the vagina. Itching, pain, or swelling in the vagina. Pain during sex. Pain or burning when you pee. How is this diagnosed? This condition is diagnosed based on your symptoms, medical history, and an exam. This may include a pelvic exam. Tests may also be done. Tests may be done to: Check the pH level of your vagina. Check the fluid in your vagina. How is this treated? Treatment will depend on what is causing your vaginitis. Treatment may include: Antibiotics. Antifungal medicines. Medicines to treat symptoms if you have a virus. Your sex partner should also be treated. Estrogen medicines. Medicines to treat allergies. The medicines may be pills or creams. Follow these instructions at home: Lifestyle Keep the area around your vagina clean and dry. Avoid using soap. Rinse the area with water. Until your health care provider says it's okay: Do not douche. Do not use tampons. Use pads, if needed. Do not have sex. Wipe  from front to back after going to the bathroom. When the provider says it's okay, practice safe sex. Use condoms. General instructions Take your medicines only as told. If you were given antibiotics, take them as told. Do not stop taking them even if you start to feel better. How is this prevented? Use mild, unscented products. Avoid the following products if they are scented: Sprays. Detergents. Tampons. Products for cleaning the vagina. Soaps or bubble baths. Let air reach your genital area. To do this: Wear cotton underwear. Do not wear underwear while you sleep. Do not wear tight pants and underwear or pantyhose without a cotton panel. Do not wear thong underwear. Take off any wet clothing, such as bathing suits, as soon as possible. Practice safe sex. Use condoms. Contact a health care provider if: You have pain in the belly or around the pelvis. You have a fever or chills. You have symptoms that last for more than 2-3 days. This information is not intended to replace advice given to you by your health care provider. Make sure you discuss any questions you have with your health care provider. Document Revised: 06/04/2023 Document Reviewed: 01/12/2023 Elsevier Patient Education  2024 [employer].

## 2024-01-12 NOTE — Progress Notes (Signed)
 Subjective:    Patient ID: Jasmine Pruitt, female    DOB: [birth year], 48 y.o.   MRN: [id number]  HPI  Discussed the use of AI scribe software for clinical note transcription with the patient, who gave verbal consent to proceed.  Jasmine Pruitt is a 48 year old female who presents with vaginal itching and discharge.  She has been experiencing vaginal itching and discharge since Friday. The discharge is described as 'white clunky' and similar to 'cottage cheese'.  No pelvic pain, abnormal bleeding, or urinary symptoms. There are no immediate concerns about sexually transmitted diseases, but she is open to evaluation.       Review of Systems   Past Medical History:  Diagnosis Date   Anemia    Iron deficiency   Heart murmur    as a child - resolved   Hypertension     Current Outpatient Medications  Medication Sig Dispense Refill   hydrochlorothiazide  (HYDRODIURIL ) 25 MG tablet Take 1 tablet (25 mg total) by mouth daily. 90 tablet 3   Multiple Vitamin (MULTIVITAMIN) tablet Take 1 tablet by mouth daily.     No current facility-administered medications for this visit.    Allergies  Allergen Reactions   Sulfa Antibiotics Rash and Other (See Comments)    Other reaction(s): Unknown    Family History  Problem Relation Age of Onset   Diabetes Mother    Schizophrenia Mother    Hypertension Father    Heart attack Father    Healthy Sister    Healthy Brother    Breast cancer Maternal Aunt    Healthy Sister    Healthy Brother    Stroke Neg Hx    Colon cancer Neg Hx    Ovarian cancer Neg Hx     Social History   Socioeconomic History   Marital status: Single    Spouse name: Not on file   Number of children: Not on file   Years of education: Not on file   Highest education level: Not on file  Occupational History   Not on file  Tobacco Use   Smoking status: Never   Smokeless tobacco: Never  Vaping Use   Vaping status: Never Used  Substance and Sexual Activity    Alcohol use: Not Currently    Comment: occasionally   Drug use: No    Comment: occasional marijuana 1-2 x per month   Sexual activity: Yes    Birth control/protection: Injection    Comment: Depo - on and off since 48 years old  Other Topics Concern   Not on file  Social History Narrative   Not on file   Social Drivers of Health   Financial Resource Strain: Not on file  Food Insecurity: Not on file  Transportation Needs: Not on file  Physical Activity: Not on file  Stress: Not on file  Social Connections: Not on file  Intimate Partner Violence: Not on file     Constitutional: Denies fever, malaise, fatigue, headache or abrupt weight changes.  Respiratory: Denies difficulty breathing, shortness of breath, cough or sputum production.   Cardiovascular: Denies chest pain, chest tightness, palpitations or swelling in the hands or feet.  Gastrointestinal: Denies abdominal pain, bloating, constipation, diarrhea or blood in the stool.  GU: Patient reports vaginal itching and discharge.  Denies urgency, frequency, pain with urination, burning sensation, blood in urine, odor.  No other specific complaints in a complete review of systems (except as listed in HPI above).  Objective:   Physical Exam  BP 128/82 (BP Location: Left Arm, Patient Position: Sitting, Cuff Size: Normal)   Ht 5\' 9"  (1.753 m)   Wt 168 lb 3.2 oz (76.3 kg)   LMP 12/18/2023 (Approximate)   BMI 24.84 kg/m   Wt Readings from Last 3 Encounters:  12/11/23 171 lb 3.2 oz (77.7 kg)  09/02/23 166 lb (75.3 kg)  08/25/23 160 lb (72.6 kg)    General: Appears her stated age, well developed, well nourished in NAD. Cardiovascular: Normal rate. Pulmonary/Chest: Normal effort. Pelvic: Self swab  Neurological: Alert and oriented.   BMET    Component Value Date/Time   NA 140 12/11/2023 1531   K 3.6 12/11/2023 1531   CL 100 12/11/2023 1531   CO2 33 (H) 12/11/2023 1531   GLUCOSE 76 12/11/2023 1531   BUN 9  12/11/2023 1531   CREATININE 0.98 12/11/2023 1531   CALCIUM  9.0 12/11/2023 1531   GFRNONAA 79 05/24/2019 0900   GFRAA 91 05/24/2019 0900    Lipid Panel     Component Value Date/Time   CHOL 195 12/11/2023 1531   TRIG 220 (H) 12/11/2023 1531   HDL 58 12/11/2023 1531   CHOLHDL 3.4 12/11/2023 1531   VLDL 12 03/02/2017 0834   LDLCALC 103 (H) 12/11/2023 1531    CBC    Component Value Date/Time   WBC 7.6 12/11/2023 1531   RBC 4.61 12/11/2023 1531   HGB 12.7 12/11/2023 1531   HCT 38.7 12/11/2023 1531   PLT 358 12/11/2023 1531   MCV 83.9 12/11/2023 1531   MCH 27.5 12/11/2023 1531   MCHC 32.8 12/11/2023 1531   RDW 12.6 12/11/2023 1531   LYMPHSABS 1,396 11/22/2020 0859   MONOABS 0.3 09/07/2016 1839   EOSABS 8 (L) 11/22/2020 0859   BASOSABS 31 11/22/2020 0859    Hgb A1C Lab Results  Component Value Date   HGBA1C 5.6 12/11/2023            Assessment & Plan:   Assessment and Plan    Vaginal itching and discharge Vaginal pruritus and white clumpy discharge suggest yeast infection. Differential includes bacterial vaginosis, gonorrhea, chlamydia, and trichomoniasis. Wet prep to confirm. - Prescribed fluconazole  150 mg orally, single dose. - Ordered wet prep for bacterial vaginosis, yeast, gonorrhea, chlamydia, and trichomoniasis. - Will contact her with wet prep results in two days.     RTC in 11 months for your annual exam Helayne Lo, NP

## 2024-01-14 LAB — CERVICOVAGINAL ANCILLARY ONLY
Bacterial Vaginitis (gardnerella): POSITIVE — AB
Chlamydia: NEGATIVE
Comment: NEGATIVE
Comment: NEGATIVE
Comment: NEGATIVE
Comment: NORMAL
Neisseria Gonorrhea: NEGATIVE
Trichomonas: NEGATIVE

## 2024-01-15 MED ORDER — METRONIDAZOLE 500 MG PO TABS: 500.0000 mg | ORAL_TABLET | Freq: Two times a day (BID) | ORAL | 0 refills | Status: DC

## 2024-01-15 NOTE — Addendum Note (Signed)
 Addended by: Carollynn Cirri on: 01/15/2024 08:30 AM   Modules accepted: Orders

## 2024-06-20 ENCOUNTER — Encounter: Payer: Self-pay | Admitting: Internal Medicine

## 2024-06-20 ENCOUNTER — Ambulatory Visit: Payer: MEDICAID | Admitting: Internal Medicine

## 2024-06-20 ENCOUNTER — Other Ambulatory Visit (HOSPITAL_COMMUNITY)
Admission: RE | Admit: 2024-06-20 | Discharge: 2024-06-20 | Disposition: A | Discharge status: Home / Self Care | Payer: MEDICAID | Source: Ambulatory Visit | Attending: Internal Medicine | Admitting: Internal Medicine

## 2024-06-20 VITALS — BP 110/68 | Ht 69.0 in | Wt 168.0 lb

## 2024-06-20 DIAGNOSIS — N898 Other specified noninflammatory disorders of vagina: Secondary | ICD-10-CM

## 2024-06-20 NOTE — Progress Notes (Unsigned)
 Subjective:    Patient ID: Jasmine Pruitt, female    DOB: 30-May-1976, 48 y.o.   MRN: [id number]  HPI   Discussed the use of AI scribe software for clinical note transcription with the patient, who gave verbal consent to proceed.  Jasmine Pruitt is a 48 year old female who presents with recurrent vaginal discharge.  She experiences persistent vaginal discharge without associated itching. The discharge is neither colored nor odorous. She has a history of bacterial vaginosis occurring a couple of times in the past, with the last episode in April 2024. No recent bubble baths, douching, or use of Summer's Eve products.  She is sexually active and has previously undergone swab tests for bacterial vaginosis. No pelvic pain, fever, chills, nausea, or vomiting.       Review of Systems   Past Medical History:  Diagnosis Date   Anemia    Iron deficiency   Heart murmur    as a child - resolved   Hypertension     Current Outpatient Medications  Medication Sig Dispense Refill   hydrochlorothiazide  (HYDRODIURIL ) 25 MG tablet Take 1 tablet (25 mg total) by mouth daily. 90 tablet 3   metroNIDAZOLE  (FLAGYL ) 500 MG tablet Take 1 tablet (500 mg total) by mouth 2 (two) times daily. Do not drink alcohol while taking this medicine. 14 tablet 0   Multiple Vitamin (MULTIVITAMIN) tablet Take 1 tablet by mouth daily.     No current facility-administered medications for this visit.    Allergies  Allergen Reactions   Sulfa Antibiotics Rash and Other (See Comments)    Other reaction(s): Unknown    Family History  Problem Relation Age of Onset   Diabetes Mother    Schizophrenia Mother    Hypertension Father    Heart attack Father    Healthy Sister    Healthy Brother    Breast cancer Maternal Aunt    Healthy Sister    Healthy Brother    Stroke Neg Hx    Colon cancer Neg Hx    Ovarian cancer Neg Hx     Social History   Socioeconomic History   Marital status: Single    Spouse name: Not  on file   Number of children: Not on file   Years of education: Not on file   Highest education level: Not on file  Occupational History   Not on file  Tobacco Use   Smoking status: Never   Smokeless tobacco: Never  Vaping Use   Vaping status: Never Used  Substance and Sexual Activity   Alcohol use: Not Currently    Comment: occasionally   Drug use: No    Comment: occasional marijuana 1-2 x per month   Sexual activity: Yes    Birth control/protection: Injection    Comment: Depo - on and off since 48 years old  Other Topics Concern   Not on file  Social History Narrative   Not on file   Social Drivers of Health   Financial Resource Strain: Not on file  Food Insecurity: Not on file  Transportation Needs: Not on file  Physical Activity: Not on file  Stress: Not on file  Social Connections: Not on file  Intimate Partner Violence: Not on file     Constitutional: Denies fever, malaise, fatigue, headache or abrupt weight changes.  Respiratory: Denies difficulty breathing, shortness of breath, cough or sputum production.   Cardiovascular: Denies chest pain, chest tightness, palpitations or swelling in the hands or feet.  Gastrointestinal:  Denies abdominal pain, bloating, constipation, diarrhea or blood in the stool.  GU: Patient reports vaginal discharge.  Denies urgency, frequency, pain with urination, burning sensation, blood in urine, odor.  No other specific complaints in a complete review of systems (except as listed in HPI above).      Objective:   Physical Exam  BP 110/68 (BP Location: Left Arm, Patient Position: Sitting, Cuff Size: Normal)   Ht 5' 9 (1.753 m)   Wt 168 lb (76.2 kg)   LMP 06/10/2024 (Approximate)   BMI 24.81 kg/m    Wt Readings from Last 3 Encounters:  01/12/24 168 lb 3.2 oz (76.3 kg)  12/11/23 171 lb 3.2 oz (77.7 kg)  09/02/23 166 lb (75.3 kg)    General: Appears her stated age, well developed, well nourished in NAD. Cardiovascular:  Normal rate. Pulmonary/Chest: Normal effort. Pelvic: Self swab  Neurological: Alert and oriented.   BMET    Component Value Date/Time   NA 140 12/11/2023 1531   K 3.6 12/11/2023 1531   CL 100 12/11/2023 1531   CO2 33 (H) 12/11/2023 1531   GLUCOSE 76 12/11/2023 1531   BUN 9 12/11/2023 1531   CREATININE 0.98 12/11/2023 1531   CALCIUM  9.0 12/11/2023 1531   GFRNONAA 79 05/24/2019 0900   GFRAA 91 05/24/2019 0900    Lipid Panel     Component Value Date/Time   CHOL 195 12/11/2023 1531   TRIG 220 (H) 12/11/2023 1531   HDL 58 12/11/2023 1531   CHOLHDL 3.4 12/11/2023 1531   VLDL 12 03/02/2017 0834   LDLCALC 103 (H) 12/11/2023 1531    CBC    Component Value Date/Time   WBC 7.6 12/11/2023 1531   RBC 4.61 12/11/2023 1531   HGB 12.7 12/11/2023 1531   HCT 38.7 12/11/2023 1531   PLT 358 12/11/2023 1531   MCV 83.9 12/11/2023 1531   MCH 27.5 12/11/2023 1531   MCHC 32.8 12/11/2023 1531   RDW 12.6 12/11/2023 1531   LYMPHSABS 1,396 11/22/2020 0859   MONOABS 0.3 09/07/2016 1839   EOSABS 8 (L) 11/22/2020 0859   BASOSABS 31 11/22/2020 0859    Hgb A1C Lab Results  Component Value Date   HGBA1C 5.6 12/11/2023            Assessment & Plan:   Assessment and Plan    Vaginal discharge and recurrent bacterial vaginosis Chronic discharge without pruritus, odor, or color change. Recurrent bacterial vaginosis, last in April 2024. Differential includes bacterial vaginosis, candidiasis, gonorrhea, chlamydia, trichomoniasis. Discussed pH imbalance causes. - Perform swab test for bacterial vaginosis, candidiasis, gonorrhea, chlamydia, trichomoniasis. - Consider boric acid vaginal suppositories daily for 30 days if bacterial vaginosis confirmed. - Consider oral probiotics for one month to reset pH balance. - Follow up with test results in a day.          RTC in 4months for your annual exam Jasmine Laura, NP

## 2024-06-20 NOTE — Patient Instructions (Signed)
 Chlamydia Test Why am I having this test? Chlamydia is a common sexually transmitted infection (STI), especially in people younger than 47 years old. Chlamydia may be associated with other STIs, such as gonorrhea. In some cases, there are no symptoms, especially early in the infection. Your health care provider may test you for chlamydia if you: Are sexually active. Have another STI. Have possible symptoms of chlamydia infection, such as pelvic pain or discharge. What is being tested? This test checks for the presence of the bacteria Chlamydia trachomatis, which causes chlamydia infection. What kind of sample is taken?     Depending on your symptoms, your health care provider may collect one of the following samples: A urine sample. This is collected in a germ-free (sterile) container that is given to you by your health care provider or the lab where the urine will be examined and tested. A fluid sample. This may be collected by swabbing one of the following: The vagina or the lower part of the uterus (cervix), if this applies. The part of your body that drains urine from your bladder (urethra). The upper part of your throat behind the nose (nasopharynx). The rectum. Tell a health care provider about: All medicines you are taking, including vitamins, herbs, eye drops, creams, and over-the-counter medicines. Any surgeries you have had. Any medical conditions you have. Whether you are pregnant or may be pregnant, if this applies. How are the results reported? Your test results will be reported as either positive or negative for chlamydia. It is up to you to get your test results. Ask your health care provider, or the department that is doing the test, when your results will be ready. What do the results mean? A positive result means that you have a chlamydia infection. A negative result is normal and means that you do not have a chlamydia infection. Talk with your health care provider  about what your results mean. Questions to ask your health care provider Ask your health care provider, or the department that is doing the test: When will my results be ready? How will I get my results? What other tests do I need? Summary This test may be done to see if you have chlamydia, which is a common sexually transmitted infection (STI). Depending on your symptoms, your health care provider may collect samples of urine or fluid. The fluid sample is collected by swabbing your vagina, cervix, urethra, nasopharynx, or rectum. Your test results will be reported as either positive or negative for chlamydia. Ask your health care provider, or the department that is doing the test, when your results will be ready. This information is not intended to replace advice given to you by your health care provider. Make sure you discuss any questions you have with your health care provider. Document Revised: 06/10/2021 Document Reviewed: 06/10/2021 Elsevier Patient Education  2024 [employer].

## 2024-06-22 ENCOUNTER — Ambulatory Visit: Payer: Self-pay | Admitting: Internal Medicine

## 2024-06-22 LAB — CERVICOVAGINAL ANCILLARY ONLY
Bacterial Vaginitis (gardnerella): POSITIVE — AB
Candida Glabrata: NEGATIVE
Candida Vaginitis: NEGATIVE
Chlamydia: NEGATIVE
Comment: NEGATIVE
Comment: NEGATIVE
Comment: NEGATIVE
Comment: NEGATIVE
Comment: NEGATIVE
Comment: NORMAL
Neisseria Gonorrhea: NEGATIVE
Trichomonas: NEGATIVE

## 2024-06-22 MED ORDER — METRONIDAZOLE 500 MG PO TABS: 500.0000 mg | ORAL_TABLET | Freq: Two times a day (BID) | ORAL | 0 refills | Status: AC
